# Patient Record
Sex: Male | Born: 1985 | Race: White | Hispanic: No | Marital: Married | Smoking: Current some day smoker
Health system: Southern US, Community
[De-identification: ages and names within clinical notes are randomized; demographics above are authoritative.]

---

## 2017-06-11 ENCOUNTER — Encounter: Payer: Self-pay | Admitting: *Deleted

## 2017-06-11 ENCOUNTER — Encounter: Payer: Self-pay | Admitting: Emergency Medicine

## 2017-06-11 ENCOUNTER — Emergency Department
Admission: EM | Admit: 2017-06-11 | Discharge: 2017-06-11 | Disposition: A | Discharge status: Home / Self Care | Payer: BLUE CROSS/BLUE SHIELD | Attending: Emergency Medicine | Admitting: Emergency Medicine

## 2017-06-11 ENCOUNTER — Ambulatory Visit (INDEPENDENT_AMBULATORY_CARE_PROVIDER_SITE_OTHER)
Admission: EM | Admit: 2017-06-11 | Discharge: 2017-06-11 | Disposition: A | Discharge status: Other Acute Inpatient Hospital | Payer: BLUE CROSS/BLUE SHIELD | Source: Home / Self Care | Attending: Family Medicine | Admitting: Family Medicine

## 2017-06-11 DIAGNOSIS — R002 Palpitations: Secondary | ICD-10-CM | POA: Diagnosis present

## 2017-06-11 DIAGNOSIS — I471 Supraventricular tachycardia: Secondary | ICD-10-CM

## 2017-06-11 DIAGNOSIS — R0789 Other chest pain: Secondary | ICD-10-CM | POA: Diagnosis not present

## 2017-06-11 DIAGNOSIS — F17200 Nicotine dependence, unspecified, uncomplicated: Secondary | ICD-10-CM | POA: Diagnosis not present

## 2017-06-11 DIAGNOSIS — R0602 Shortness of breath: Secondary | ICD-10-CM

## 2017-06-11 LAB — BASIC METABOLIC PANEL
ANION GAP: 6 (ref 5–15)
BUN: 15 mg/dL (ref 6–20)
CHLORIDE: 106 mmol/L (ref 101–111)
CO2: 29 mmol/L (ref 22–32)
Calcium: 9.3 mg/dL (ref 8.9–10.3)
Creatinine, Ser: 0.88 mg/dL (ref 0.61–1.24)
GFR calc Af Amer: >60 mL/min (ref >60)
GFR calc non Af Amer: >60 mL/min (ref >60)
GLUCOSE: 119 mg/dL — AB (ref 65–99)
POTASSIUM: 4.8 mmol/L (ref 3.5–5.1)
Sodium: 141 mmol/L (ref 135–145)

## 2017-06-11 LAB — CBC WITH DIFFERENTIAL/PLATELET
Basophils Absolute: 0 10*3/uL (ref 0–0.1)
Basophils Relative: 1 %
EOS PCT: 2 %
Eosinophils Absolute: 0.1 10*3/uL (ref 0–0.7)
HEMATOCRIT: 44.1 % (ref 40.0–52.0)
Hemoglobin: 15.7 g/dL (ref 13.0–18.0)
LYMPHS PCT: 23 %
Lymphs Abs: 1.5 10*3/uL (ref 1.0–3.6)
MCH: 31.9 pg (ref 26.0–34.0)
MCHC: 35.5 g/dL (ref 32.0–36.0)
MCV: 90 fL (ref 80.0–100.0)
Monocytes Absolute: 0.8 10*3/uL (ref 0.2–1.0)
Monocytes Relative: 12 %
Neutro Abs: 4.1 10*3/uL (ref 1.4–6.5)
Neutrophils Relative %: 62 %
Platelets: 261 10*3/uL (ref 150–440)
RBC: 4.9 MIL/uL (ref 4.40–5.90)
RDW: 13.2 % (ref 11.5–14.5)
WBC: 6.5 10*3/uL (ref 3.8–10.6)

## 2017-06-11 NOTE — ED Provider Notes (Signed)
[facility]    CSN: [id number] Arrival date & time: 06/11/17  3614     History   Chief Complaint Chief Complaint  Patient presents with  . Chest Pain    HPI Marc Roman is a 31 y.o. male.   31 yo male with a c/o "chest discomfort", "heart racing" and shortness of breath that started about 1 hour ago while at work. States he was sitting when symptoms began. Denies dizziness, fainting, numbness/tingling and states he's otherwise healthy.    The history is provided by the patient.  Chest Pain    No past medical history on file.  There are no active problems to display for this patient.   No past surgical history on file.     Home Medications    Prior to Admission medications   Not on File    Family History Family History  Problem Relation Age of Onset  . Anxiety disorder Mother     Social History Social History  Substance Use Topics  . Smoking status: Current Some Day Smoker  . Smokeless tobacco: Never Used  . Alcohol use Yes     Allergies   Penicillins   Review of Systems Review of Systems  Cardiovascular: Positive for chest pain.     Physical Exam Triage Vital Signs ED Triage Vitals  Enc Vitals Group     BP 06/11/17 0855 (!) 145/94     Pulse Rate 06/11/17 0855 (!) 195     Resp 06/11/17 0855 18     Temp 06/11/17 0855 97.7 F (36.5 C)     Temp Source 06/11/17 0855 Oral     SpO2 06/11/17 0855 100 %     Weight 06/11/17 0856 190 lb (86.2 kg)     Height 06/11/17 0856 6\' 2"  (1.88 m)     Head Circumference --      Peak Flow --      Pain Score 06/11/17 0857 0     Pain Loc --      Pain Edu? --      Excl. in [place]? --    No data found.   Updated Vital Signs BP (!) 145/94 (BP Location: Right Arm)   Pulse (!) 195   Temp 97.7 F (36.5 C) (Oral)   Resp 18   Ht 6\' 2"  (1.88 m)   Wt 190 lb (86.2 kg)   SpO2 100%   BMI 24.39 kg/m   Visual Acuity Right Eye Distance:   Left Eye Distance:   Bilateral Distance:     Right Eye Near:   Left Eye Near:    Bilateral Near:     Physical Exam  Constitutional: He appears well-developed and well-nourished. No distress.  Cardiovascular: Normal heart sounds.  Tachycardia present.   Pulmonary/Chest: Effort normal. No respiratory distress. He has no wheezes. He has no rales.  Musculoskeletal: He exhibits no edema.  Skin: He is not diaphoretic.  Nursing note and vitals reviewed.    UC Treatments / Results  Labs (all labs ordered are listed, but only abnormal results are displayed) Labs Reviewed - No data to display  EKG  EKG Interpretation None       Radiology No results found.  Procedures .EKG Date/Time: 06/11/2017 9:42 AM Performed by: Payton Mccallum Authorized by: Payton Mccallum   ECG reviewed by ED Physician in the absence of a cardiologist: yes   Previous ECG:    Previous ECG:  Unavailable Interpretation:    Interpretation: abnormal   Rate:  ECG rate assessment: tachycardic   Rhythm:    Rhythm: SVT   Ectopy:    Ectopy: none   QRS:    QRS axis:  Normal   (including critical care time)  Medications Ordered in UC Medications - No data to display   Initial Impression / Assessment and Plan / UC Course  I have reviewed the triage vital signs and the nursing notes.  Pertinent labs & imaging results that were available during my care of the patient were reviewed by me and considered in my medical decision making (see chart for details).      Final Clinical Impressions(s) / UC Diagnoses   Final diagnoses:  SVT (supraventricular tachycardia) (HCC)    New Prescriptions There are no discharge medications for this patient.  1. ekg results and diagnosis reviewed with patient; attempted vagal maneuvers without change; recommend patient go to ED by EMS for further evaluation and management. Report called to charge RN at [facility].   Controlled Substance Prescriptions [place] Controlled Substance Registry consulted? Not Applicable    Payton Mccallum, MD 06/11/17 [phone]

## 2017-06-11 NOTE — Discharge Instructions (Signed)
Please seek medical attention for any high fevers, chest pain, shortness of breath, change in behavior, persistent vomiting, bloody stool or any other new or concerning symptoms.  

## 2017-06-11 NOTE — ED Triage Notes (Addendum)
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 Patient reported to [facility] care where it was discovered that the patient was in SVT at az rate of 205bpm. Patient self converted before EMS administration of medication. Patient had no complaints currently

## 2017-06-11 NOTE — ED Triage Notes (Signed)
PAtient started having new onset chest discomfort 20 minutes before arrival to [facility]. Patient has no history of previous cardiac conditions.

## 2017-06-11 NOTE — ED Provider Notes (Signed)
[facility] Emergency Department Provider Note   ____________________________________________   I have reviewed the triage vital signs and the nursing notes.   HISTORY  Chief Complaint Palpitations   History limited by: Not cooperative   HPI Marc Vanderlinde is a 31 y.o. male who presents to the emergency department today because of concerns for supraventricular tachycardia found on EKG at urgent care. the patient went to urgent care today because of concerns for palpitations. He states he was at work when he started feeling like his heart was racing. He did have some discomfort and shortness of breath with this. the patient states that he did have some alcohol last night. He denies any caffeine use. Denies any history of SVT in the past. Patient's rhythm did convert spontaneously to normal sinus rhythm during transport to the emergency department.   History reviewed. No pertinent past medical history.  There are no active problems to display for this patient.   History reviewed. No pertinent surgical history.  Prior to Admission medications   Not on File    Allergies Penicillins  Family History  Problem Relation Age of Onset  . Anxiety disorder Mother     Social History Social History  Substance Use Topics  . Smoking status: Current Some Day Smoker  . Smokeless tobacco: Never Used  . Alcohol use Yes    Review of Systems Constitutional: No fever/chills Eyes: No visual changes. ENT: No sore throat. Cardiovascular: positive for palpitations. Respiratory: positive for shortness of breath. Gastrointestinal: No abdominal pain.  No nausea, no vomiting.  No diarrhea.   Genitourinary: Negative for dysuria. Musculoskeletal: Negative for back pain. Skin: Negative for rash. Neurological: Negative for headaches, focal weakness or numbness.  ____________________________________________   PHYSICAL EXAM:  VITAL SIGNS: ED Triage Vitals  [06/11/17 0957]  Enc Vitals Group     BP      Pulse      Resp      Temp      Temp src      SpO2      Weight 190 lb (86.2 kg)     Height  (1.88 m)     Head Circumference      Peak Flow      Pain Score 0   Constitutional: Alert and oriented. Well appearing and in no distress. Eyes: Conjunctivae are normal.  ENT   Head: Normocephalic and atraumatic.   Nose: No congestion/rhinnorhea.   Mouth/Throat: Mucous membranes are moist.   Neck: No stridor. Hematological/Lymphatic/Immunilogical: No cervical lymphadenopathy. Cardiovascular: Normal rate, regular rhythm.  No murmurs, rubs, or gallops.  Respiratory: Normal respiratory effort without tachypnea nor retractions. Breath sounds are clear and equal bilaterally. No wheezes/rales/rhonchi. Gastrointestinal: Soft and non tender. No rebound. No guarding.  Genitourinary: Deferred Musculoskeletal: Normal range of motion in all extremities. No lower extremity edema. Neurologic:  Normal speech and language. No gross focal neurologic deficits are appreciated.  Skin:  Skin is warm, dry and intact. No rash noted. Psychiatric: Mood and affect are normal. Speech and behavior are normal. Patient exhibits appropriate insight and judgment.  ____________________________________________    LABS (pertinent positives/negatives)  CBC wnl BMP wnl save for glu 119  ____________________________________________   EKG  I, Phineas Semen, attending physician, personally viewed and interpreted this EKG  EKG Time: 1000 Rate: 103 Rhythm: sinus tachycardia Axis: left axis deviation Intervals: qtc 442 QRS: LAFB ST changes: no st elevation Impression: abnormal ekg  ____________________________________________    RADIOLOGY  None  ____________________________________________  PROCEDURES  Procedures  ____________________________________________   INITIAL IMPRESSION / ASSESSMENT AND PLAN / ED COURSE  Pertinent labs &  imaging results that were available during my care of the patient were reviewed by me and considered in my medical decision making (see chart for details).  patient presented to the emergency department today because of episode of SVT. By the time patient arrived to the emergency department he was back in a normal sinus rhythm. Blood work without any concerning findings. Did discuss with patient SVT and Valsalva maneuvers. Discussed caffeine and alcohol use.   ____________________________________________   FINAL CLINICAL IMPRESSION(S) / ED DIAGNOSES  Final diagnoses:  SVT (supraventricular tachycardia) (HCC)     Note: This dictation was prepared with Dragon dictation. Any transcriptional errors that result from this process are unintentional     Phineas Semen, MD 06/11/17 1152

## 2019-11-27 ENCOUNTER — Other Ambulatory Visit: Payer: Self-pay

## 2019-11-27 ENCOUNTER — Ambulatory Visit
Admission: EM | Admit: 2019-11-27 | Discharge: 2019-11-27 | Disposition: A | Discharge status: Home / Self Care | Payer: BC Managed Care – PPO | Attending: Family Medicine | Admitting: Family Medicine

## 2019-11-27 DIAGNOSIS — G43109 Migraine with aura, not intractable, without status migrainosus: Secondary | ICD-10-CM | POA: Insufficient documentation

## 2019-11-27 LAB — CBC WITH DIFFERENTIAL/PLATELET
Abs Immature Granulocytes: 0.01 10*3/uL (ref 0.00–0.07)
Basophils Absolute: 0 10*3/uL (ref 0.0–0.1)
Basophils Relative: 1 %
Eosinophils Absolute: 0.1 10*3/uL (ref 0.0–0.5)
Eosinophils Relative: 1 %
HCT: 41.9 % (ref 39.0–52.0)
Hemoglobin: 14.8 g/dL (ref 13.0–17.0)
Immature Granulocytes: 0 %
Lymphocytes Relative: 35 %
Lymphs Abs: 1.7 10*3/uL (ref 0.7–4.0)
MCH: 31.1 pg (ref 26.0–34.0)
MCHC: 35.3 g/dL (ref 30.0–36.0)
MCV: 88 fL (ref 80.0–100.0)
Monocytes Absolute: 0.5 10*3/uL (ref 0.1–1.0)
Monocytes Relative: 11 %
Neutro Abs: 2.5 10*3/uL (ref 1.7–7.7)
Neutrophils Relative %: 52 %
Platelets: 302 10*3/uL (ref 150–400)
RBC: 4.76 MIL/uL (ref 4.22–5.81)
RDW: 13 % (ref 11.5–15.5)
WBC: 4.8 10*3/uL (ref 4.0–10.5)
nRBC: 0 % (ref 0.0–0.2)

## 2019-11-27 LAB — TSH: TSH: 2.8 u[IU]/mL (ref 0.350–4.500)

## 2019-11-27 LAB — MAGNESIUM: Magnesium: 2 mg/dL (ref 1.7–2.4)

## 2019-11-27 LAB — BASIC METABOLIC PANEL
Anion gap: 11 (ref 5–15)
BUN: 13 mg/dL (ref 6–20)
CO2: 24 mmol/L (ref 22–32)
Calcium: 9 mg/dL (ref 8.9–10.3)
Chloride: 98 mmol/L (ref 98–111)
Creatinine, Ser: 0.97 mg/dL (ref 0.61–1.24)
GFR calc Af Amer: >60 mL/min (ref >60)
GFR calc non Af Amer: >60 mL/min (ref >60)
Glucose, Bld: 134 mg/dL — ABNORMAL HIGH (ref 70–99)
Potassium: 4 mmol/L (ref 3.5–5.1)
Sodium: 133 mmol/L — ABNORMAL LOW (ref 135–145)

## 2019-11-27 NOTE — Discharge Instructions (Signed)
Tylenol/ibuprofen as needed Follow up with Primary Care provider Follow up as needed if symptoms worsen or new symptoms develop

## 2019-11-27 NOTE — ED Provider Notes (Signed)
[facility]    CSN: [id number] Arrival date & time: 11/27/19  1254      History   Chief Complaint Chief Complaint  Patient presents with  . Numbness    HPI Marc Roman is a 34 y.o. male.   34 yo male with a c/o numbness and tingling to the right side of his face and his tongue that started spontaneously about 45 minutes ago. States he also feels a mild left sided forehead pain just above the right eye. Denies any vision loss or changes, unilateral weakness, facial weakness, speech or swallowing problems, palpitations, chest pains, shortness of breath, recent illness.      History reviewed. No pertinent past medical history.  There are no problems to display for this patient.   History reviewed. No pertinent surgical history.     Home Medications    Prior to Admission medications   Medication Sig Start Date End Date Taking? Authorizing Provider  finasteride (PROSCAR) 5 MG tablet Take 5 mg by mouth daily.   Yes [provider]    Family History Family History  Problem Relation Age of Onset  . Anxiety disorder Mother     Social History Social History   Tobacco Use  . Smoking status: Current Some Day Smoker    Types: Cigars  . Smokeless tobacco: Never Used  Substance Use Topics  . Alcohol use: Yes    Comment: occasionally  . Drug use: No     Allergies   Penicillins   Review of Systems Review of Systems   Physical Exam Triage Vital Signs ED Triage Vitals  Enc Vitals Group     BP 11/27/19 1313 124/82     Pulse Rate 11/27/19 1313 (!) 101     Resp 11/27/19 1313 18     Temp 11/27/19 1313 97.8 F (36.6 C)     Temp Source 11/27/19 1313 Oral     SpO2 11/27/19 1313 100 %     Weight 11/27/19 1310 170 lb (77.1 kg)     Height 11/27/19 1310 6\' 2"  (1.88 m)     Head Circumference --      Peak Flow --      Pain Score 11/27/19 1309 1     Pain Loc --      Pain Edu? --      Excl. in [place]? --    No data found.  Updated  Vital Signs BP 124/82 (BP Location: Left Arm)   Pulse (!) 101   Temp 97.8 F (36.6 C) (Oral)   Resp 18   Ht 6\' 2"  (1.88 m)   Wt 77.1 kg   SpO2 100%   BMI 21.83 kg/m   Visual Acuity Right Eye Distance:   Left Eye Distance:   Bilateral Distance:    Right Eye Near:   Left Eye Near:    Bilateral Near:     Physical Exam Vitals and nursing note reviewed.  Constitutional:      General: He is not in acute distress.    Appearance: He is not toxic-appearing or diaphoretic.  HENT:     Right Ear: Tympanic membrane normal.     Left Ear: Tympanic membrane normal.  Eyes:     Extraocular Movements: Extraocular movements intact.     Pupils: Pupils are equal, round, and reactive to light.  Cardiovascular:     Rate and Rhythm: Normal rate.  Pulmonary:     Effort: Pulmonary effort is normal. No respiratory distress.  Musculoskeletal:  Cervical back: Neck supple.  Neurological:     General: No focal deficit present.     Mental Status: He is alert and oriented to person, place, and time.     Cranial Nerves: No cranial nerve deficit.     Sensory: No sensory deficit.     Motor: No weakness.     Coordination: Coordination normal.     Gait: Gait normal.     Deep Tendon Reflexes: Reflexes normal.      UC Treatments / Results  Labs (all labs ordered are listed, but only abnormal results are displayed) Labs Reviewed  BASIC METABOLIC PANEL - Abnormal; Notable for the following components:      Result Value   Sodium 133 (*)    Glucose, Bld 134 (*)    All other components within normal limits  CBC WITH DIFFERENTIAL/PLATELET  MAGNESIUM  TSH    EKG   Radiology No results found.  Procedures ED EKG  Date/Time: 11/27/2019 4:49 PM Performed by: Payton Mccallum, MD Authorized by: Payton Mccallum, MD   ECG reviewed by ED Physician in the absence of a cardiologist: yes   Previous ECG:    Previous ECG:  Compared to current   Similarity:  Changes noted Interpretation:     Interpretation: abnormal   Rate:    ECG rate:  78   ECG rate assessment: normal   Rhythm:    Rhythm: sinus rhythm   Ectopy:    Ectopy: none   QRS:    QRS axis:  Normal Conduction:    Conduction: abnormal     Abnormal conduction: incomplete RBBB   ST segments:    ST segments:  Normal T waves:    T waves: normal     (including critical care time)  Medications Ordered in UC Medications - No data to display  Initial Impression / Assessment and Plan / UC Course  I have reviewed the triage vital signs and the nursing notes.  Pertinent labs & imaging results that were available during my care of the patient were reviewed by me and considered in my medical decision making (see chart for details).      Final Clinical Impressions(s) / UC Diagnoses   Final diagnoses:  Migraine with aura and without status migrainosus, not intractable     Discharge Instructions     Tylenol/ibuprofen as needed Follow up with Primary Care provider Follow up as needed if symptoms worsen or new symptoms develop    ED Prescriptions    None      1. Lab results and diagnosis reviewed with patient; discussed normal physical exam findings, EKG findings, and lab result findings 2. Of note during the course of the visit patient reports his numbness symptoms resolved and he only felt the mild pain over his right eye (forehead area) 3. Return precautions given  4. Recommend supportive treatment as above  5. Follow-up prn if symptoms worsen or don't improve   PDMP not reviewed this encounter.   Payton Mccallum, MD 11/27/19 [phone]

## 2019-11-27 NOTE — ED Triage Notes (Signed)
Patient states that he started noticing the right side of face started to feel numb around 45 mins ago. Reports that now he feels like the right side of his tongue is also numb.

## 2019-11-29 ENCOUNTER — Other Ambulatory Visit: Payer: Self-pay | Admitting: Family Medicine

## 2019-11-29 ENCOUNTER — Other Ambulatory Visit (HOSPITAL_COMMUNITY): Payer: Self-pay | Admitting: Family Medicine

## 2019-11-29 ENCOUNTER — Ambulatory Visit
Admission: RE | Admit: 2019-11-29 | Discharge: 2019-11-29 | Disposition: A | Discharge status: Home / Self Care | Payer: BC Managed Care – PPO | Source: Ambulatory Visit | Attending: Family Medicine | Admitting: Family Medicine

## 2019-11-29 ENCOUNTER — Other Ambulatory Visit: Payer: Self-pay

## 2019-11-29 DIAGNOSIS — R2689 Other abnormalities of gait and mobility: Secondary | ICD-10-CM | POA: Diagnosis present

## 2019-11-29 DIAGNOSIS — R278 Other lack of coordination: Secondary | ICD-10-CM

## 2019-11-29 MED ORDER — GADOBUTROL 1 MMOL/ML IV SOLN
7.0000 mL | Freq: Once | INTRAVENOUS | Status: AC | PRN
  Administered 2019-11-29: 15:00:00 7 mL via INTRAVENOUS

## 2019-11-30 ENCOUNTER — Encounter: Payer: Self-pay | Admitting: Neurology

## 2019-12-01 ENCOUNTER — Ambulatory Visit: Payer: BC Managed Care – PPO | Attending: Internal Medicine

## 2019-12-01 DIAGNOSIS — Z20822 Contact with and (suspected) exposure to covid-19: Secondary | ICD-10-CM

## 2019-12-02 LAB — NOVEL CORONAVIRUS, NAA: SARS-CoV-2, NAA: NOT DETECTED

## 2019-12-04 ENCOUNTER — Other Ambulatory Visit: Payer: BC Managed Care – PPO

## 2019-12-06 NOTE — Progress Notes (Signed)
NEUROLOGY CONSULTATION NOTE  Marc Roman MRN: [id number] DOB: 1986-06-25  Referring provider: Blair Heys, MD Primary care provider: Blair Heys, MD  Reason for consult:  Possible multiple sclerosis  HISTORY OF PRESENT ILLNESS: Marc Roman is a 34 year old right-handed white male who presents for numbness and questionable multiple sclerosis.  History supplemented by Urgent Care and referring provider notes.  MRI of brain personally reviewed.  Lip sudden mild headache  Got worse and when 2 hours afster neck.  Numb persisted.  Then next evening coordination and balance on right  Double vision and vertigo in morning briefly.  Not since past Tuesdays.  20 min 5 minu inside of tongue and mouth    On 11/27/2019, he developed sudden onset numbness and tingling involving the right side of his lip, which spread to involve the right side of his inner cheek and long the right jaw line.  Denied facial or extremity weakness, visual disturbance, speech disturbance.  He had a mild headache on his right eyebrow.  He had remote history of migraines as a child and adolescent but this was different.  He was evaluated at [facility] where he was diagnosed with migraine.  He went home.  While the headache resolved, he continued to have right sided facial sensory disturbance.  He also noted that his balance was off and that he had trouble with coordination of his right hand, such as difficulty writing.  In the morning, he would wake up with some dizziness and double vision.  This went on for about 5 days.  He followed up with his PCP who ordered an MRI of the brain with and without contrast performed on 11/29/2019 which demonstrated a 14 mm hyperintense focus in the right brachium pontis as well as few hyperintense foci in the periventricular white matter with possible involvement of the posterior callososeptal interface.  No abnormal enhancement.  Currently, he  still notes some right sided lower facial sensory disturbance and feels slightly unsteady on his feet.  Denies prior neurologic events.  Labs: 11/27/2019:  CBC with WBC 4.8, HGB 14.8, HCT 41.9, PLT 302, ALC 1.7; BMP with Na 133, K 4, Cl 98, CO2 24, glucose 134, BUN 13, Cr 0.97; TSH 2.800. 12/01/2019:  SARS-CoV-2 negative.  Family history is notable for a maternal cousin with brain cancer and maternal great grandfather who died of a cerebral aneurysm.  No family history of multiple sclerosis.  PAST MEDICAL HISTORY: No past medical history on file.  PAST SURGICAL HISTORY: No past surgical history on file.  MEDICATIONS: Current Outpatient Medications on File Prior to Visit  Medication Sig Dispense Refill  . finasteride (PROSCAR) 5 MG tablet Take 5 mg by mouth daily.     No current facility-administered medications on file prior to visit.    ALLERGIES: Allergies  Allergen Reactions  . Penicillins Rash    FAMILY HISTORY: Family History  Problem Relation Age of Onset  . Anxiety disorder Mother     SOCIAL HISTORY: Social History   Socioeconomic History  . Marital status: Married    Spouse name: Not on file  . Number of children: Not on file  . Years of education: Not on file  . Highest education level: Not on file  Occupational History  . Not on file  Tobacco Use  . Smoking status: Current Some Day Smoker    Types: Cigars  . Smokeless tobacco: Never Used  Substance and Sexual Activity  . Alcohol  use: Yes    Comment: occasionally  . Drug use: No  . Sexual activity: Not on file  Other Topics Concern  . Not on file  Social History Narrative  . Not on file   Social Determinants of Health   Financial Resource Strain:   . Difficulty of Paying Living Expenses:   Food Insecurity:   . Worried About Charity fundraiser in the Last Year:   . Arboriculturist in the Last Year:   Transportation Needs:   . Film/video editor (Medical):   Marland Kitchen Lack of Transportation  (Non-Medical):   Physical Activity:   . Days of Exercise per Week:   . Minutes of Exercise per Session:   Stress:   . Feeling of Stress :   Social Connections:   . Frequency of Communication with Friends and Family:   . Frequency of Social Gatherings with Friends and Family:   . Attends Religious Services:   . Active Member of Clubs or Organizations:   . Attends Archivist Meetings:   Marland Kitchen Marital Status:   Intimate Partner Violence:   . Fear of Current or Ex-Partner:   . Emotionally Abused:   Marland Kitchen Physically Abused:   . Sexually Abused:     REVIEW OF SYSTEMS: Constitutional: No fevers, chills, or sweats, no generalized fatigue, change in appetite Eyes: No visual changes, double vision, eye pain Ear, nose and throat: No hearing loss, ear pain, nasal congestion, sore throat Cardiovascular: No chest pain, palpitations Respiratory:  No shortness of breath at rest or with exertion, wheezes GastrointestinaI: No nausea, vomiting, diarrhea, abdominal pain, fecal incontinence Genitourinary:  No dysuria, urinary retention or frequency Musculoskeletal:  No neck pain, back pain Integumentary: No rash, pruritus, skin lesions Neurological: as above Psychiatric: No depression, insomnia, anxiety Endocrine: No palpitations, fatigue, diaphoresis, mood swings, change in appetite, change in weight, increased thirst Hematologic/Lymphatic:  No purpura, petechiae. Allergic/Immunologic: no itchy/runny eyes, nasal congestion, recent allergic reactions, rashes  PHYSICAL EXAM: Blood pressure 118/85, pulse 82, resp. rate 18, height 6\' 2"  (1.88 m), weight 169 lb (76.7 kg), SpO2 98 %. General: No acute distress.  Patient appears well-groomed.  Head:  Normocephalic/atraumatic Eyes:  fundi examined but not visualized Neck: supple, no paraspinal tenderness, full range of motion Back: No paraspinal tenderness Heart: regular rate and rhythm Lungs: Clear to auscultation bilaterally. Vascular: No  carotid bruits. Neurological Exam: Mental status: alert and oriented to person, place, and time, recent and remote memory intact, fund of knowledge intact, attention and concentration intact, speech fluent and not dysarthric, language intact. Cranial nerves: CN I: not tested CN II: pupils equal, round and reactive to light, visual fields intact CN III, IV, VI:  full range of motion, no nystagmus, no ptosis CN V: facial sensation intact CN VII: upper and lower face symmetric CN VIII: hearing intact CN IX, X: gag intact, uvula midline CN XI: sternocleidomastoid and trapezius muscles intact CN XII: tongue midline Bulk & Tone: normal, no fasciculations. Motor:  5/5 throughout  Sensation:  Pinprick and vibration sensation intact. Deep Tendon Reflexes:  2+ throughout, toes downgoing.  Finger to nose testing:  Without dysmetria.  Heel to shin:  Without dysmetria.  Gait:  Normal station and stride.  Able to turn but difficulty with tandem walk Romberg negative.  IMPRESSION: 1.  Cerebral white matter abnormality on brain MRI in setting of facial numbness, incoordination and diplopia.  Concern for demyelinating disease such as multiple sclerosis.  PLAN: 1.  We will  proceed with MS workup:  - Serum for ANA, sed rate, ANCA, SSA/SSB antibodies, vitamin D   - MRI of cervical and thoracic spine with and without contrast  -  If MRI of spinal cord unremarkable, proceed with lumbar puncture for CSF analysis of cell count, glucose, protein, gram stain and culture, oligoclonal bands, IgG index. 2.  Follow up after testing to discuss results and plan.  Thank you for allowing me to take part in the care of this patient.  Shon Millet, DO  CC:  Blair Heys, MD

## 2019-12-07 ENCOUNTER — Other Ambulatory Visit: Payer: Self-pay

## 2019-12-07 ENCOUNTER — Encounter: Payer: Self-pay | Admitting: Neurology

## 2019-12-07 ENCOUNTER — Ambulatory Visit: Payer: BC Managed Care – PPO | Admitting: Neurology

## 2019-12-07 ENCOUNTER — Telehealth: Payer: Self-pay

## 2019-12-07 ENCOUNTER — Other Ambulatory Visit (INDEPENDENT_AMBULATORY_CARE_PROVIDER_SITE_OTHER): Payer: BC Managed Care – PPO

## 2019-12-07 VITALS — BP 118/85 | HR 82 | Resp 18 | Ht 74.0 in | Wt 169.0 lb

## 2019-12-07 DIAGNOSIS — R9082 White matter disease, unspecified: Secondary | ICD-10-CM

## 2019-12-07 DIAGNOSIS — R200 Anesthesia of skin: Secondary | ICD-10-CM

## 2019-12-07 DIAGNOSIS — R2681 Unsteadiness on feet: Secondary | ICD-10-CM

## 2019-12-07 NOTE — Telephone Encounter (Signed)
Request Status: Authorized Health Plan: [employer] Valid Dates: 12/07/2019 - 06/03/2020 Scheduled Date of Service: 12/07/2019 Member Information: Marc Roman, Marc Roman Member #: [id number] [address] [place] , [place] [id number] Date of Birth: 02-Dec-1985 Phone: [phone] Ordering Provider: Brooke Dare [address] [place] , [place] [id number] Phone: [phone] Fax: [phone] NPI: [id number] Servicing Provider: [facility] [address] [place] , [place] [address] Phone: [phone] Fax: [phone] NPI: [id number] TIN: [id number]

## 2019-12-07 NOTE — Patient Instructions (Signed)
1.  We will check some blood work today:  ANA, sed rate, ANCA, SSA/SSB antibodies, vitamin D level 2.  We will also schedule for MRI of cervical and thoracic spine with and without contrast 3.  We may need to order a spinal tap, however it depends on the MRI findings.  We will check cell count with diff, glucose, protein, gram stain and culture, oligoclonal bands, IgG index. 4.  Follow up after testing

## 2019-12-07 NOTE — Telephone Encounter (Signed)
Mri of cervical spine with and without placed and MRI thoracic spine with and without ordered for March 30 at [facility] at 330. Pending authorizationt to be sent call number [phone].

## 2019-12-08 LAB — ANA: Anti Nuclear Antibody (ANA): NEGATIVE

## 2019-12-08 LAB — SEDIMENTATION RATE: Sed Rate: 2 mm/h (ref 0–15)

## 2019-12-08 LAB — SJOGREN'S SYNDROME ANTIBODS(SSA + SSB)
SSA (Ro) (ENA) Antibody, IgG: <1 AI
SSB (La) (ENA) Antibody, IgG: <1 AI

## 2019-12-08 LAB — ANCA SCREEN W REFLEX TITER: ANCA Screen: NEGATIVE

## 2019-12-08 LAB — VITAMIN D 25 HYDROXY (VIT D DEFICIENCY, FRACTURES): Vit D, 25-Hydroxy: 9 ng/mL — ABNORMAL LOW (ref 30–100)

## 2019-12-08 NOTE — Telephone Encounter (Signed)
Close encounter 

## 2019-12-20 ENCOUNTER — Other Ambulatory Visit: Payer: Self-pay

## 2019-12-20 ENCOUNTER — Ambulatory Visit
Admission: RE | Admit: 2019-12-20 | Discharge: 2019-12-20 | Disposition: A | Discharge status: Home / Self Care | Payer: BC Managed Care – PPO | Source: Ambulatory Visit | Attending: Neurology | Admitting: Neurology

## 2019-12-20 DIAGNOSIS — R200 Anesthesia of skin: Secondary | ICD-10-CM

## 2019-12-20 DIAGNOSIS — R2681 Unsteadiness on feet: Secondary | ICD-10-CM | POA: Diagnosis present

## 2019-12-20 DIAGNOSIS — R9082 White matter disease, unspecified: Secondary | ICD-10-CM

## 2019-12-20 MED ORDER — GADOBUTROL 1 MMOL/ML IV SOLN
7.5000 mL | Freq: Once | INTRAVENOUS | Status: AC | PRN
  Administered 2019-12-20: 17:00:00 7.5 mL via INTRAVENOUS

## 2019-12-29 ENCOUNTER — Telehealth (INDEPENDENT_AMBULATORY_CARE_PROVIDER_SITE_OTHER): Payer: BC Managed Care – PPO | Admitting: Neurology

## 2019-12-29 ENCOUNTER — Other Ambulatory Visit: Payer: Self-pay

## 2019-12-29 DIAGNOSIS — G35 Multiple sclerosis: Secondary | ICD-10-CM

## 2019-12-29 NOTE — Progress Notes (Signed)
Labs ordered Per DR. Jaffe. LVMOM for pt.

## 2019-12-29 NOTE — Patient Instructions (Addendum)
1.  I have listed several MS medications below.  After you review them, contact me to let me know which you would like to try.  2.  In the meantime, I would like you to start vitamin D supplementation.  Take over the counter D3 4000 IU daily.  3.  I would like to check some blood work that may be needed prior to starting some of these medications.  Contact our office when you plan on coming for your labs (we will place the order at the lab on the second floor of our building, where you had the previous labs drawn).  4.  I would like to refer you to the [facility] Multiple Sclerosis Society website for further information:  Nationalmssociety.org 5.  I will have you follow up with me in 6 months.   Multiple Sclerosis Multiple sclerosis (MS) is a disease of the brain, spinal cord, and optic nerves (central nervous system). It causes the body's disease-fighting (immune) system to destroy the protective covering (myelin sheath) around nerves in the brain. When this happens, signals (nerve impulses) going to and from the brain and spinal cord do not get sent properly or may not get sent at all. There are several types of MS:  Relapsing-remitting MS. This is the most common type. This causes sudden attacks of symptoms. After an attack, you may recover completely until the next attack, or some symptoms may remain permanently.  Secondary progressive MS. This usually develops after the onset of relapsing-remitting MS. Similar to relapsing-remitting MS, this type also causes sudden attacks of symptoms. Attacks may be less frequent, but symptoms slowly get worse (progress) over time.  Primary progressive MS. This causes symptoms that steadily progress over time. This type of MS does not cause sudden attacks of symptoms. The age of onset of MS varies, but it often develops between 35-41 years of age. MS is a lifelong (chronic) condition. There is no cure, but treatment can help slow down the progression of the  disease. What are the causes? The cause of this condition is not known. What increases the risk? You are more likely to develop this condition if:  You are a woman.  You have a relative with MS. However, the condition is not passed from parent to child (inherited).  You have a lack (deficiency) of vitamin D.  You smoke. MS is more common in the [place] than in the [place]. What are the signs or symptoms? Relapsing-remitting and secondary progressive MS cause symptoms to occur in episodes or attacks that may last weeks to months. There may be long periods between attacks in which there are almost no symptoms. Primary progressive MS causes symptoms to steadily progress after they develop. Symptoms of MS vary because of the many different ways it affects the central nervous system. The main symptoms include:  Vision problems and eye pain.  Numbness.  Weakness.  Inability to move your arms, hands, feet, or legs (paralysis).  Balance problems.  Shaking that you cannot control (tremors).  Muscle spasms.  Problems with thinking (cognitive changes). MS can also cause symptoms that are associated with the disease, but are not always the direct result of an MS attack. They may include:  Inability to control urination or bowel movements (incontinence).  Headaches.  Fatigue.  Inability to tolerate heat.  Emotional changes.  Depression.  Pain. How is this diagnosed? This condition is diagnosed based on:  Your symptoms.  A neurological exam. This involves checking central  nervous system function, such as nerve function, reflexes, and coordination.  MRIs of the brain and spinal cord.  Lab tests, including a lumbar puncture that tests the fluid that surrounds the brain and spinal cord (cerebrospinal fluid).  Tests to measure the electrical activity of the brain in response to stimulation (evoked potentials). How is this treated? There is no  cure for MS, but medicines can help decrease the number and frequency of attacks and help relieve nuisance symptoms. Treatment options may include:  Medicines that reduce the frequency of attacks. These medicines may be given by injection, by mouth (orally), or through an IV.  Medicines that reduce inflammation (steroids). These may provide short-term relief of symptoms.  Medicines to help control pain, depression, fatigue, or incontinence.  Vitamin D, if you have a deficiency.  Using devices to help you move around (assistive devices), such as braces, a cane, or a walker.  Physical therapy to strengthen and stretch your muscles.  Occupational therapy to help you with everyday tasks.  Alternative or complementary treatments such as exercise, massage, or acupuncture. Follow these instructions at home:  Take over-the-counter and prescription medicines only as told by your health care provider.  Do not drive or use heavy machinery while taking prescription pain medicine.  Use assistive devices as recommended by your physical therapist or your health care provider.  Exercise as directed by your health care provider.  Return to your normal activities as told by your health care provider. Ask your health care provider what activities are safe for you.  Reach out for support. Share your feelings with friends, family, or a support group.  Keep all follow-up visits as told by your health care provider and therapists. This is important. Where to find more information  National Multiple Sclerosis Society: https://www.nationalmssociety.org Contact a health care provider if:  You feel depressed.  You develop new pain or numbness.  You have tremors.  You have problems with sexual function. Get help right away if:  You develop paralysis.  You develop numbness.  You have problems with your bladder or bowel function.  You develop double vision.  You lose vision in one or both  eyes.  You develop suicidal thoughts.  You develop severe confusion. If you ever feel like you may hurt yourself or others, or have thoughts about taking your own life, get help right away. You can go to your nearest emergency department or call:  Your local emergency services (911 in the [place]  A suicide crisis helpline, such as the National Suicide Prevention Lifeline at (518)424-4664. This is open 24 hours a day. Summary  Multiple sclerosis (MS) is a disease of the central nervous system that causes the body's immune system to destroy the protective covering (myelin sheath) around nerves in the brain.  There are 3 types of MS: relapsing-remitting, secondary progressive, and primary progressive. Relapsing-remitting and secondary progressive MS cause symptoms to occur in episodes or attacks that may last weeks to months. Primary progressive MS causes symptoms to steadily progress after they develop.  There is no cure for MS, but medicines can help decrease the number and frequency of attacks and help relieve nuisance symptoms. Treatment may also include physical or occupational therapy.  If you develop numbness, paralysis, vision problems, or other neurological symptoms, get help right away. This information is not intended to replace advice given to you by your health care provider. Make sure you discuss any questions you have with your health care provider. Document Revised: 08/20/2017  Document Reviewed: 11/16/2016 Elsevier Patient Education  2020 Elsevier Inc.  MS MEDICATIONS:  AUBAGIO Teriflunomide oral tablets What is this medicine? TERIFLUNOMIDE (TER i FLOO noe mide) treats multiple sclerosis. It can decrease the number of flare-ups. This medicine is not a cure. This medicine may be used for other purposes; ask your health care provider or pharmacist if you have questions. COMMON BRAND NAME(S): AUBAGIO What should I tell my health care provider before I take this  medicine? They need to know if you have any of these conditions:  diabetes  have a fever or infection  high blood pressure  immune system problems  kidney disease  liver disease  low blood cell counts, like low white cell, platelet, or red cell counts  lung or breathing disease, like asthma  recently received or scheduled to receive a vaccine  receiving treatment for cancer  skin conditions or sensitivity  tingling of the fingers or toes, or other nerve disorder  tuberculosis  an unusual or allergic reaction to teriflunomide, leflunomide, other medicines, food, dyes, or preservatives  pregnant or trying to get pregnant  breast-feeding How should I use this medicine? Take this medicine by mouth with a glass of water. Follow the directions on the prescription label. You can take it with or without food. If it upsets your stomach, take it with food. Take your medicine at regular intervals. Do not take it more often that directed. Do not stop taking except on your doctor's advice. A special MedGuide will be given to you by the pharmacist with each prescription and refill. Be sure to read this information carefully each time. Talk to your pediatrician regarding the use of this medicine in children. Special care may be needed. Overdosage: If you think you have taken too much of this medicine contact a poison control center or emergency room at once. NOTE: This medicine is only for you. Do not share this medicine with others. What if I miss a dose? If you miss a dose, take it as soon as you can. If it is almost time for your next dose, take only that dose. Do not take double or extra doses. What may interact with this medicine? Do not take this medicine with any of the following medications:  leflunomide This medicine may also interact with the following medications:  alosetron  birth control pills  caffeine  cefaclor  certain medicines for diabetes like nateglinide,  repaglinide, rosiglitazone, pioglitazone  certain medicines for high cholesterol like atorvastatin, pravastatin, rosuvastatin, simvastatin  charcoal  cholestyramine  ciprofloxacin  duloxetine  furosemide  ketoprofen  live virus vaccines  medicines that increase your risk for infection  methotrexate  mitoxantrone  paclitaxel  penicillin  theophylline  tizanidine  warfarin This list may not describe all possible interactions. Give your health care provider a list of all the medicines, herbs, non-prescription drugs, or dietary supplements you use. Also tell them if you smoke, drink alcohol, or use illegal drugs. Some items may interact with your medicine. What should I watch for while using this medicine? Visit your health care provider for regular checks on your progress. Tell your doctor or health care provider if your symptoms do not start to get better or if they get worse. You may need blood work done while you are taking this medicine. This medicine may cause serious skin reactions. They can happen weeks to months after starting the medicine. Contact your health care provider right away if you notice fevers or flu-like symptoms with a  rash. The rash may be red or purple and then turn into blisters or peeling of the skin. Or, you might notice a red rash with swelling of the face, lips or lymph nodes in your neck or under your arms. This medicine may stay in your body for up to 2 years after your last dose. Tell your doctor about any unusual side effects or symptoms. A medicine can be given to help lower your blood levels of this medicine more quickly. Women must use effective birth control with this medicine. There is a potential for serious side effects to an unborn child. Do not become pregnant while taking this medicine. Inform your doctor if you wish to become pregnant. This medicine remains in your blood after you stop taking it. You must continue using effective birth  control until the blood levels have been checked and they are low enough. A medicine can be given to help lower your blood levels of this medicine more quickly. Immediately talk to your doctor if you think you may be pregnant. You may need a pregnancy test. Talk to your health care provider or pharmacist for more information. For men, your partner should not become pregnant while you are taking this medicine. There is a potential for serious side effects to an unborn child. You and your male partner should use effective birth control during your treatment. This medicine is found in the semen of men. This medicine remains in your blood after you stop taking it. Men who wish to father a child should continue using effective birth control until the blood levels of this medicine have been checked and they are low enough. A medicine can be given to help lower your blood levels of this medicine more quickly. You should not receive certain vaccines during your treatment and for 6 months after your treatment with this medication ends. What side effects may I notice from receiving this medicine? Side effects that you should report to your doctor or health care professional as soon as possible:  allergic reactions like skin rash, itching or hives, swelling of the face, lips, or tongue  breathing problems  cough  increased blood pressure  low blood counts - this medicine may decrease the number of white blood cells and platelets. You may be at increased risk for infections and bleeding.  pain, tingling, numbness in the hands or feet  rash, fever, and swollen lymph nodes  redness, blistering, peeling or loosening of the skin, including inside the mouth  signs of decreased platelets or bleeding - bruising, pinpoint red spots on the skin, black, tarry stools, blood in urine  signs of infection - fever or chills, cough, sore throat, pain or trouble passing urine  signs and symptoms of liver injury like  dark yellow or brown urine; general ill feeling or flu-like symptoms; light-colored stools; loss of appetite; nausea; right upper belly pain; unusually weak or tired; yellowing of the eyes or skin  trouble passing urine or change in the amount of urine  vomiting Side effects that usually do not require medical attention (report to your doctor or health care professional if they continue or are bothersome):  diarrhea  hair thinning or loss  headache  nausea  tiredness This list may not describe all possible side effects. Call your doctor for medical advice about side effects. You may report side effects to FDA at 1-800-FDA-1088. Where should I keep my medicine? Keep out of the reach of children. Store between 20 to 25 degrees  C (68 to 77 degrees F). Throw away any unused medicine after the expiration date. NOTE: This sheet is a summary. It may not cover all possible information. If you have questions about this medicine, talk to your doctor, pharmacist, or health care provider.  2020 Elsevier/Gold Standard (2018-12-16 15:39:47)    VUMERITY: Diroximel fumarate delayed-release capsules What is this medicine? DIROXIMEL FUMARATE (dye ROX i mel FUE ma rate) helps to decrease the number of multiple sclerosis relapses in people with relapsing-remitting forms of the disease. It is not a cure. This medicine may be used for other purposes; ask your health care provider or pharmacist if you have questions. COMMON BRAND NAME(S): VUMERITY What should I tell my health care provider before I take this medicine? They need to know if you have any of these conditions:  immune system problems  infection  kidney disease  liver disease  low blood counts, like white cells  an unusual or allergic reaction to diroximel fumarate, dimethyl fumarate, other medicines, foods, dyes, or preservatives  pregnant or trying to get pregnant  breast-feeding How should I use this medicine? Take this  medicine by mouth with a glass of water. Follow the directions on the prescription label. Do not cut, crush, or chew this medicine. Swallow the capsules whole. You can take it with or without food. If it upsets your stomach, take it with food. Do not drink alcohol at the time you take a dose of this medicine. Take your medicine at regular intervals. Do not take it more often than directed. Do not stop taking except on your doctor's advice. Talk to your pediatrician regarding the use of this medicine in children. Special care may be needed. Overdosage: If you think you have taken too much of this medicine contact a poison control center or emergency room at once. NOTE: This medicine is only for you. Do not share this medicine with others. What if I miss a dose? If you miss a dose, take it as soon as you can. If it is almost time for your next dose, take only that dose. Do not take double or extra doses. What may interact with this medicine? Do not take this medicine with the following medications:  dimethyl fumarate  monomethyl fumarate This medicine may also interact with the following medications:  alcohol This list may not describe all possible interactions. Give your health care provider a list of all the medicines, herbs, non-prescription drugs, or dietary supplements you use. Also tell them if you smoke, drink alcohol, or use illegal drugs. Some items may interact with your medicine. What should I watch for while using this medicine? Visit your health care professional for regular checks on your progress. Tell your health care professional if your symptoms do not start to get better or if they get worse. You may need blood work done while you are taking this medicine. Taking your medicine with food (avoid high-fat, high-calorie meal or snack) may help reduce flushing. Call your doctor if the flushing from this medicine bothers you or does not go away. Ask your doctor if taking aspirin before  taking this medicine may reduce flushing. This medicine may increase your risk of getting an infection. Call your health care professional for advice if you get a fever, chills, or sore throat, or other symptoms of a cold or flu. Do not treat yourself. Try to avoid being around people who are sick. In some patients, this medicine may cause a serious brain infection that may  cause death. If you have any problems seeing, thinking, speaking, walking, or standing, tell your healthcare professional right away. If you cannot reach your healthcare professional, urgently seek other source of medical care. What side effects may I notice from receiving this medicine? Side effects that you should report to your doctor or health care professional as soon as possible:  allergic reactions like skin rash, itching or hives, swelling of the face, lips, or tongue  fever, chills, sore throat  signs and symptoms of liver injury like dark yellow or brown urine; general ill feeling or flu-like symptoms; light-colored stools; loss of appetite; nausea; right upper belly pain; unusually weak or tired; yellowing of the eyes or skin  signs and symptoms of a serious brain infection like changes in vision, confusion, weakness on one side of the body, loss of balance or coordination, loss of memory, or changes in personality Side effects that usually do not require medical attention (report to your doctor or health care professional if they continue or are bothersome):  diarrhea  facial flushing  nausea  stomach pain  upset stomach This list may not describe all possible side effects. Call your doctor for medical advice about side effects. You may report side effects to FDA at 1-800-FDA-1088. Where should I keep my medicine? Keep out of the reach of children. Store at room temperature between 20 and 25 degrees C (68 and 77 degrees F). Throw away any unused medicine after the expiration date. NOTE: This sheet is a  summary. It may not cover all possible information. If you have questions about this medicine, talk to your doctor, pharmacist, or health care provider.  2020 Elsevier/Gold Standard (2019-03-31 16:27:28)  ZEPOSIA Ozanimod capsules What is this medicine? OZANIMOD (oh ZAN i mod) helps prevent relapses of multiple sclerosis. The medicine does not cure multiple sclerosis. This medicine may be used for other purposes; ask your health care provider or pharmacist if you have questions. COMMON BRAND NAME(S): ZEPOSIA What should I tell my health care provider before I take this medicine? They need to know if you have any of these conditions:  diabetes  heart disease  high blood pressure  history of irregular heartbeat  history of stroke  immune system problems  infection (especially a virus infection such as chickenpox, cold sores, or herpes)  liver disease  low blood counts, like low white cell, platelet, or red cell counts  lung or breathing disease, like asthma or sleep apnea  skin cancer  recently received or scheduled to receive a vaccine  uveitis  an unusual or allergic reaction to ozanimod, other medicines, foods, dyes, or preservatives  pregnant or trying to get pregnant  breast-feeding How should I use this medicine? Take this medicine by mouth with a glass of water. Follow the directions on the prescription label. Do not cut, crush or chew this medicine. Swallow the capsules whole. You can take it with or without food. If it upsets your stomach, take it with food. Take your medicine at regular intervals. Do not take it more often than directed. Do not stop taking except on your doctor's advice. A special MedGuide will be given to you by the pharmacist with each prescription and refill. Be sure to read this information carefully each time. Talk to your pediatrician regarding the use of this medicine in children. Special care may be needed. Overdosage: If you think you  have taken too much of this medicine contact a poison control center or emergency room at once.  NOTE: This medicine is only for you. Do not share this medicine with others. What if I miss a dose? If you miss 1 or more doses during the first 14 days of treatment, talk to your healthcare provider. You will need to begin another starter pack. If you miss a dose after the first 14 days of treatment, skip it. Take your next dose at the normal time. Do not take extra or 2 doses at the same time to make up for the missed dose. What may interact with this medicine? Do not take this medicine with any of the following medications:  linezolid  MAOIs like Azilect, Carbex, Eldepryl, Marplan, Nardil, Parnate, and Xadago This medicine may also interact with the following medications:  alemtuzumab  certain medicines for blood pressure, heart disease, irregular heart beat  certain medicines for depression, anxiety  clopidogrel  cyclosporine  eltrombopag  gemfibrozil  live virus vaccines  medicines that lower your chance of fighting infection  other medicines that prolong the QT interval (abnormal heart rhythm)  narcotic medicines for pain  phenylephrine  pseudoephedrine  rifampin  stimulant medicines for attention disorders, weight loss, or staying awake  tryptophan  tyramine This list may not describe all possible interactions. Give your health care provider a list of all the medicines, herbs, non-prescription drugs, or dietary supplements you use. Also tell them if you smoke, drink alcohol, or use illegal drugs. Some items may interact with your medicine. What should I watch for while using this medicine? Visit your health care professional for regular checks on your progress. Tell your health care professional if your symptoms do not start to get better or if they get worse. You may need blood work done while you are taking this medicine. Foods that contain very high amounts of  tyramine, such as aged, fermented, cured, smoked and pickled foods, should be avoided while taking this medicine. The combination may cause a dangerous rise in blood pressure. Ask your doctor or health care professional, pharmacist, or nutritionist for a complete listing of foods and beverages that are high in tyramine. If you consume a food or beverage very rich in tyramine and do not feel well soon after eating, contact your health care provider. Tell your doctor or health care professional right away if you have any change in your eyesight. Talk with your doctor if you have not had chickenpox or the vaccine for chickenpox. This medicine can make you more sensitive to the sun. Keep out of the sun. If you cannot avoid being in the sun, wear protective clothing and sunscreen. Do not use sun lamps or tanning beds/booths. Women should inform their doctor if they wish to become pregnant or think they might be pregnant. There is a potential for serious side effects to an unborn child. If you are a male who can become pregnant, you should use effective birth control during your treatment with this medicine and for at least 3 months after you stop taking it. Talk to your health care professional or pharmacist for more information. If you stop taking this medicine, your MS symptoms may get worse. You may have more weakness, trouble using your arms or legs, or changes in balance. Talk to your healthcare provider right away if your symptoms get worse. What side effects may I notice from receiving this medicine? Side effects that you should report to your doctor or health care professional as soon as possible:  allergic reactions like skin rash, itching or hives, swelling of the  face, lips, or tongue  breathing problems  changes in vision  confusion  headache with fever, neck stiffness, sensitivity to light, nausea, or confusion  high blood pressure  loss of memory  problems with balance, talking, or  walking  signs and symptoms of a dangerous change in heartbeat or heart rhythm like chest pain; dizziness; fast or irregular heartbeat; palpitations; feeling faint or lightheaded, falls; breathing problems  signs and symptoms of infection like fever or chills; cough; sore throat; pain or trouble passing urine  signs and symptoms of liver injury like dark yellow or brown urine; general ill feeling or flu-like symptoms; light-colored stools; loss of appetite; nausea; right upper belly pain; unusually weak or tired; yellowing of the eyes or skin  change in your skin's appearance (color, change in a mole or freckle, new growth)  seizures  sudden numbness or weakness of the face, arm or leg  unusually slow heartbeat Side effects that usually do not require medical attention (report to your doctor or health care professional if they continue or are bothersome):  back pain  dizziness  sinus trouble This list may not describe all possible side effects. Call your doctor for medical advice about side effects. You may report side effects to FDA at 1-800-FDA-1088. Where should I keep my medicine? Keep out of the reach of children. Store at room temperature between 20 and 25 degrees C (68 and 77 degrees F). Throw away any unused medicine after the expiration date. NOTE: This sheet is a summary. It may not cover all possible information. If you have questions about this medicine, talk to your doctor, pharmacist, or health care provider.  2020 Elsevier/Gold Standard (2019-02-21 14:27:29)  GILENYA Fingolimod oral capsules What is this medicine? FINGOLIMOD (fin GOL i mod) helps prevent relapses of multiple sclerosis. The medicine does not cure multiple sclerosis. This medicine may be used for other purposes; ask your health care provider or pharmacist if you have questions. COMMON BRAND NAME(S): Gilenya What should I tell my health care provider before I take this medicine? They need to know if  you have any of these conditions:  diabetes  heart disease  high blood pressure  history of irregular heartbeat  history of stroke  immune system problems  infection (especially a virus infection such as chickenpox, cold sores, or herpes)  liver disease  low blood counts, like low white cell, platelet, or red cell counts  lung or breathing disease, like asthma  skin cancer  recently received or scheduled to receive a vaccine  uveitis  an unusual or allergic reaction to fingolimod, other medicines, foods, dyes, or preservatives  pregnant or trying to get pregnant  breast-feeding How should I use this medicine? Take this medicine by mouth with a glass of water. Follow the directions on the prescription label. You can take it with or without food. If it upsets your stomach, take it with food. Take your medicine at regular intervals. Do not take it more often than directed. Do not stop taking except on your doctor's advice. A special MedGuide will be given to you by the pharmacist with each prescription and refill. Be sure to read this information carefully each time. Talk to your pediatrician regarding the use of this medicine in children. While this drug may be prescribed for children as young as 10 years, precautions do apply. Overdosage: If you think you have taken too much of this medicine contact a poison control center or emergency room at once. NOTE: This medicine  is only for you. Do not share this medicine with others. What if I miss a dose? It is important not to miss any doses. If you miss a dose, take it as soon as you can. If it is almost time for your next dose, take only that dose. Do not take double or extra doses. What may interact with this medicine? Do not take this medicine with any of the following medications:  arsenic trioxide  certain antipsychotics like pimozide, thioridazine  certain medicines for irregular heart beat like amiodarone,  disopyramide, ibutilide, procainamide, propafenone, quinidine, sotalol  certain medicines used for nausea like chlorpromazine, droperidol  certain medicines used to treat infections like chloroquine, clarithromycin, erythromycin, pentamidine  cisapride  dextromethorphan; quinidine  dronedarone  methadone  posaconazole  saquinavir This medicine may also interact with the following medications:  beta-blockers like metoprolol and propranolol  citalopram  digoxin  diltiazem  dofetilide  haloperidol  ketoconazole  live virus vaccines  medicines that lower your chance of fighting infection  mitoxantrone  natalizumab  verapamil  ziprasidone This list may not describe all possible interactions. Give your health care provider a list of all the medicines, herbs, non-prescription drugs, or dietary supplements you use. Also tell them if you smoke, drink alcohol, or use illegal drugs. Some items may interact with your medicine. What should I watch for while using this medicine? Tell your doctor or healthcare professional if your symptoms do not start to get better or if they get worse. Your vision and blood may be tested before and during use of this medicine. Tell your doctor or health care professional right away if you have any change in your eyesight. After the first dose, you will be watched for at least 6 hours. Children will also be watched for at least 6 hours if their dose is increased. If you miss more than 1 dose, you may need to be watched by a health care professional when you take your next dose; do not start taking it again without talking with your doctor. Talk with your doctor if you have not had chickenpox or the vaccine for chickenpox. This medicine can make you more sensitive to the sun. Keep out of the sun. If you cannot avoid being in the sun, wear protective clothing and use sunscreen. Do not use sun lamps or tanning beds/booths. Women should inform their  doctor if they wish to become pregnant or think they might be pregnant. There is a potential for serious side effects to an unborn child. If you are a male who can become pregnant, you should use effective birth control during your treatment with this medicine and for at least 2 months after you stop taking it. Talk to your health care professional or pharmacist for more information. If you stop taking this medicine, your MS symptoms may get worse. You may have more weakness, trouble using your arms or legs, or changes in balance. Talk to your healthcare provider right away if your symptoms get worse. What side effects may I notice from receiving this medicine? Side effects that you should report to your doctor or health care professional as soon as possible:  allergic reactions like skin rash, itching or hives, swelling of the face, lips, or tongue  breathing problems  changes in vision  confusion  headache with fever, neck stiffness, sensitivity to light, nausea, or confusion  loss of memory  problems with balance, talking, or walking  signs and symptoms of a dangerous change in heartbeat or  heart rhythm like chest pain; dizziness; fast or irregular heartbeat; palpitations; feeling faint or lightheaded, falls; breathing problems  signs and symptoms of infection like fever or chills; cough; sore throat; pain or trouble passing urine  signs and symptoms of liver injury like dark yellow or brown urine; general ill feeling or flu-like symptoms; light-colored stools; loss of appetite; nausea; right upper belly pain; unusually weak or tired; yellowing of the eyes or skin  change in your skin's appearance (color, change in a mole or freckle, new growth)  seizures  sudden numbness or weakness of the face, arm or leg  unusually slow heartbeat Side effects that usually do not require medical attention (report to your doctor or health care professional if they continue or are  bothersome):  back pain  diarrhea  headache  pain in legs or arms  sinus trouble This list may not describe all possible side effects. Call your doctor for medical advice about side effects. You may report side effects to FDA at 1-800-FDA-1088. Where should I keep my medicine? Keep out of the reach of children. Store at room temperature between 15 and 30 degrees C (59 and 86 degrees F). Throw away any unused medicine after the expiration date. NOTE: This sheet is a summary. It may not cover all possible information. If you have questions about this medicine, talk to your doctor, pharmacist, or health care provider.  2020 Elsevier/Gold Standard (2018-12-16 14:12:58)  MAYZENT Siponimod tablets What is this medicine? Siponimod (si PON i mod) may help prevent relapses of multiple sclerosis. This medicine is not a cure. This medicine may be used for other purposes; ask your health care provider or pharmacist if you have questions. COMMON BRAND NAME(S): MAYZENT What should I tell my health care provider before I take this medicine? They need to know if you have any of these conditions:  cancer  diabetes  heart disease  high blood pressure  history of irregular heartbeat  history of stroke  immune system problems  infection (especially a viral infection such as chickenpox, cold sores, or herpes)  liver disease  low blood counts, like white cells, platelets, or red blood cells  lung or breathing disease, like asthma  recently received or scheduled to receive a vaccine  uveitis  an unusual or allergic reaction to siponimod, other medicines, foods, dyes, or preservatives  pregnant or trying to get pregnant  breast-feeding How should I use this medicine? Take this medicine by mouth with a glass of water. Follow the directions on the prescription label. You can take it with or without food. If it upsets your stomach, take it with food. Take your medicine at regular  intervals. Do not take it more often than directed. Do not stop taking except on your doctor's advice. A special MedGuide will be given to you by the pharmacist with each prescription and refill. Be sure to read this information carefully each time. Talk to your pediatrician about the use of this medicine in children. Special care may be needed. Overdosage: If you think you have taken too much of this medicine contact a poison control center or emergency room at once. NOTE: This medicine is only for you. Do not share this medicine with others. What if I miss a dose? It is important not to miss any doses. Talk to your healthcare professional about what to do if you miss a dose. What may interact with this medicine? Do not take this medicine with any of the following medications:  cisapride  dronedarone  pimozide  saquinavir  thioridazine This medicine may also interact with the following medications:  alemtuzumab  certain antivirals for HIV or hepatitis  certain medicines for blood pressure, heart disease, irregular heart beat  certain medicines for fungal infections like ketoconazole, itraconazole, or posaconazole  certain medicines for seizures like carbamazepine, phenobarbital, phenytoin  dofetilide  live virus vaccines  medicines that lower your chance of fighting infection  modafinil  rifampin This list may not describe all possible interactions. Give your health care provider a list of all the medicines, herbs, non-prescription drugs, or dietary supplements you use. Also tell them if you smoke, drink alcohol, or use illegal drugs. Some items may interact with your medicine. What should I watch for while using this medicine? Visit your healthcare professional for regular checks on your progress. Tell your healthcare professional if your symptoms do not start to get better or if they get worse. In some patients, this medicine may cause a serious brain infection that may  cause death. If you have any problems seeing, thinking, speaking, walking, or standing, tell your healthcare professional right away. If you cannot reach your healthcare professional, urgently seek other source of medical care. Tell your doctor or health care professional right away if you have any change in your eyesight. Your vision may be tested before and during use of this medicine. You may need blood work done while you are taking this medicine. Call your healthcare professional for advice if you get a fever, chills, or sore throat, or other symptoms of a cold or flu. Do not treat yourself. This medicine decreases your body's ability to fight infections. Try to avoid being around people who are sick. Talk with your doctor if you have not had chickenpox or the vaccine for chickenpox. This medicine can decrease the response to a vaccine. If you need to get vaccinated, tell your healthcare professional if you have received this medicine within the last 4 weeks. Extra booster doses may be needed. Talk to your healthcare professional to see if a different vaccination schedule is needed. Talk to your healthcare professional about your risk of cancer. You may be more at risk for certain types of cancer if you take this medicine. Women should inform their doctor if they wish to become pregnant or think they might be pregnant. There is a potential for serious side effects to an unborn child. If you are a male who can become pregnant, you should use effective birth control during your treatment with this medicine and for at least 10 days after you stop taking it. Talk to your health care professional for more information. If you stop taking this medicine, your MS symptoms may get worse. You may have more weakness, trouble using your arms or legs, or changes in balance. Talk to your healthcare provider right away if your symptoms get worse. What side effects may I notice from receiving this medicine? Side  effects that you should report to your doctor or health care professional as soon as possible:  allergic reactions like skin rash, itching or hives, swelling of the face, lips, or tongue  breathing problems  changes in vision  confusion  headache with fever, neck stiffness, sensitivity to light, nausea, or confusion  signs and symptoms of a dangerous change in heartbeat or heart rhythm like chest pain; dizziness; fast or irregular heartbeat; palpitations; feeling faint or lightheaded; falls; breathing problems  signs and symptoms of a stroke like changes in vision; confusion; trouble  speaking or understanding; severe headaches; sudden numbness or weakness of the face, arm or leg; trouble walking; dizziness; loss of balance or coordination  signs and symptoms of infection like fever or chills; cough; sore throat; pain or trouble passing urine  signs and symptoms of liver injury like dark yellow or brown urine; general ill feeling or flu-like symptoms; light-colored stools; loss of appetite; nausea; right upper belly pain; unusually weak or tired; yellowing of the eyes or skin  seizures  unusually slow heartbeat Side effects that usually do not require medical attention (report to your doctor or health care professional if they continue or are bothersome):  diarrhea  dizziness  headache  pain in legs or arms This list may not describe all possible side effects. Call your doctor for medical advice about side effects. You may report side effects to FDA at 1-800-FDA-1088. Where should I keep my medicine? Keep out of the reach of children. You will be instructed on how to store this medicine. Throw away any unused medicine after the expiration date. NOTE: This sheet is a summary. It may not cover all possible information. If you have questions about this medicine, talk to your doctor, pharmacist, or health care provider.  2020 Elsevier/Gold Standard (2018-08-30  10:14:02)   TYSABRI Natalizumab injection What is this medicine? NATALIZUMAB (na ta LIZ you mab) is used to treat relapsing multiple sclerosis. This drug is not a cure. It is also used to treat Crohn's disease. This medicine may be used for other purposes; ask your health care provider or pharmacist if you have questions. COMMON BRAND NAME(S): Tysabri What should I tell my health care provider before I take this medicine? They need to know if you have any of these conditions:  immune system problems  progressive multifocal leukoencephalopathy (PML)  an unusual or allergic reaction to natalizumab, other medicines, foods, dyes, or preservatives  pregnant or trying to get pregnant  breast-feeding How should I use this medicine? This medicine is for infusion into a vein. It is given by a health care professional in a hospital or clinic setting. A special MedGuide will be given to you by the pharmacist with each prescription and refill. Be sure to read this information carefully each time. Talk to your pediatrician regarding the use of this medicine in children. This medicine is not approved for use in children. Overdosage: If you think you have taken too much of this medicine contact a poison control center or emergency room at once. NOTE: This medicine is only for you. Do not share this medicine with others. What if I miss a dose? It is important not to miss your dose. Call your doctor or health care professional if you are unable to keep an appointment. What may interact with this medicine? Do not take this medicine with any of the following medications:  biologic medicines such as adalimumab, certolizumab, etanercept, golimumab, infliximab This medicine may also interact with the following medications:  azathioprine  cyclosporine  interferons  6-mercaptopurine  methotrexate  other medicines that lower your chance of fighting an infection  steroid medicines like  prednisone or cortisone  vaccines This list may not describe all possible interactions. Give your health care provider a list of all the medicines, herbs, non-prescription drugs, or dietary supplements you use. Also tell them if you smoke, drink alcohol, or use illegal drugs. Some items may interact with your medicine. What should I watch for while using this medicine? Your condition will be monitored carefully while you  are receiving this medicine. Visit your doctor for regular check ups. Tell your doctor or healthcare professional if your symptoms do not start to get better or if they get worse. Stay away from people who are sick. Call your doctor or health care professional for advice if you get a fever, chills or sore throat, or other symptoms of a cold or flu. Do not treat yourself. In some patients, this medicine may cause a serious brain infection that may cause death. If you have any problems seeing, thinking, speaking, walking, or standing, tell your doctor right away. If you cannot reach your doctor, get urgent medical care. What side effects may I notice from receiving this medicine? Side effects that you should report to your doctor or health care professional as soon as possible:  allergic reactions like skin rash, itching or hives, swelling of the face, lips, or tongue  breathing problems  changes in vision  chest pain  confusion  depressed mood  dizziness  feeling faint; lightheaded; falls  general ill feeling or flu-like symptoms  loss of memory  missed menstrual periods  muscle weakness  problems with balance, talking, or walking  signs and symptoms of liver injury like dark yellow or brown urine; general ill feeling or flu-like symptoms; light-colored stools; loss of appetite; nausea; right upper belly pain; unusually weak or tired; yellowing of the eyes or skin  suicidal thoughts, mood changes  unusual bruising or bleeding  unusually weak or tired Side  effects that usually do not require medical attention (report to your doctor or health care professional if they continue or are bothersome):  headache  joint pain  muscle cramps  muscle pain  nausea, vomiting  pain, redness, or irritation at site where injected  tiredness This list may not describe all possible side effects. Call your doctor for medical advice about side effects. You may report side effects to FDA at 1-800-FDA-1088. Where should I keep my medicine? This drug is given in a hospital or clinic and will not be stored at home. NOTE: This sheet is a summary. It may not cover all possible information. If you have questions about this medicine, talk to your doctor, pharmacist, or health care provider.  2020 Elsevier/Gold Standard (2019-03-13 13:20:26)  OCREVUS Ocrelizumab injection What is this medicine? OCRELIZUMAB (ok re LIZ ue mab) treats multiple sclerosis. It helps to decrease the number of multiple sclerosis relapses. It is not a cure. This medicine may be used for other purposes; ask your health care provider or pharmacist if you have questions. COMMON BRAND NAME(S): OCREVUS What should I tell my health care provider before I take this medicine? They need to know if you have any of these conditions:  cancer  hepatitis B infection  other infection (especially a virus infection such as chickenpox, cold sores, or herpes)  an unusual or allergic reaction to ocrelizumab, other medicines, foods, dyes or preservatives  pregnant or trying to get pregnant  breast-feeding How should I use this medicine? This medicine is for infusion into a vein. It is given by a health care professional in a hospital or clinic setting. A special MedGuide will be given to you before each treatment. Be sure to read this information carefully each time. Talk to your pediatrician regarding the use of this medicine in children. Special care may be needed. Overdosage: If you think you  have taken too much of this medicine contact a poison control center or emergency room at once. NOTE: This medicine is  only for you. Do not share this medicine with others. What if I miss a dose? Keep appointments for follow-up doses as directed. It is important not to miss your dose. Call your doctor or health care professional if you are unable to keep an appointment. What may interact with this medicine?  alemtuzumab  daclizumab  dimethyl fumarate  fingolimod  glatiramer  interferon beta  live virus vaccines  mitoxantrone  natalizumab  peginterferon beta  rituximab  steroid medicines like prednisone or cortisone  teriflunomide This list may not describe all possible interactions. Give your health care provider a list of all the medicines, herbs, non-prescription drugs, or dietary supplements you use. Also tell them if you smoke, drink alcohol, or use illegal drugs. Some items may interact with your medicine. What should I watch for while using this medicine? Tell your doctor or healthcare professional if your symptoms do not start to get better or if they get worse. This medicine can cause serious allergic reactions. To reduce your risk you may need to take medicine before treatment with this medicine. Take your medicine as directed. Women should inform their doctor if they wish to become pregnant or think they might be pregnant. There is a potential for serious side effects to an unborn child. Talk to your health care professional or pharmacist for more information. Male patients should use effective birth control methods while receiving this medicine and for 6 months after the last dose. Call your doctor or health care professional for advice if you get a fever, chills or sore throat, or other symptoms of a cold or flu. Do not treat yourself. This drug decreases your body's ability to fight infections. Try to avoid being around people who are sick. If you have a hepatitis  B infection or a history of a hepatitis B infection, talk to your doctor. The symptoms of hepatitis B may get worse if you take this medicine. In some patients, this medicine may cause a serious brain infection that may cause death. If you have any problems seeing, thinking, speaking, walking, or standing, tell your doctor right away. If you cannot reach your doctor, urgently seek other source of medical care. This medicine can decrease the response to a vaccine. If you need to get vaccinated, tell your healthcare professional if you have received this medicine. Extra booster doses may be needed. Talk to your doctor to see if a different vaccination schedule is needed. Talk to your doctor about your risk of cancer. You may be more at risk for certain types of cancers if you take this medicine. What side effects may I notice from receiving this medicine? Side effects that you should report to your doctor or health care professional as soon as possible:  allergic reactions like skin rash, itching or hives, swelling of the face, lips, or tongue  breathing problems  facial flushing  fast, irregular heartbeat  lump or soreness in the breast  signs and symptoms of herpes such as cold sore, shingles, or genital sores  signs and symptoms of infection like fever or chills, cough, sore throat, pain or trouble passing urine  signs and symptoms of low blood pressure like dizziness; feeling faint or lightheaded, falls; unusually weak or tired  signs and symptoms of progressive multifocal leukoencephalopathy (PML) like changes in vision; clumsiness; confusion; personality changes; weakness on one side of the body  swelling of the ankles, feet, hands Side effects that usually do not require medical attention (report these to your doctor  or health care professional if they continue or are bothersome):  back pain  depressed mood  diarrhea  pain, redness, or irritation at site where injected This  list may not describe all possible side effects. Call your doctor for medical advice about side effects. You may report side effects to FDA at 1-800-FDA-1088. Where should I keep my medicine? This drug is given in a hospital or clinic and will not be stored at home. NOTE: This sheet is a summary. It may not cover all possible information. If you have questions about this medicine, talk to your doctor, pharmacist, or health care provider.  2020 Elsevier/Gold Standard (2018-09-12 07:41:53)

## 2019-12-29 NOTE — Progress Notes (Signed)
Virtual Visit via Video Note The purpose of this virtual visit is to provide medical care while limiting exposure to the novel coronavirus.    Consent was obtained for video visit:  Yes.   Answered questions that patient had about telehealth interaction:  Yes.   I discussed the limitations, risks, security and privacy concerns of performing an evaluation and management service by telemedicine. I also discussed with the patient that there may be a patient responsible charge related to this service. The patient expressed understanding and agreed to proceed.  Pt location: Home Physician Location: office Name of referring provider:  [facility] I connected with Marc Roman at patients initiation/request on 12/29/2019 at 11:30 AM EDT by video enabled telemedicine application and verified that I am speaking with the correct person using two identifiers. Pt MRN:  [id number] Pt DOB:  17-Nov-1985 Video Participants:  Marc Roman   History of Present Illness:  Marc Roman is a 33 year old right-handed white male who follows up for evaluation of multiple sclerosis.  UPDATE: Labs from 12/07/2019 showed negative ANA, sed rate 2, negative SSA/SSB antibodies, negative ANCA.  Vitamin D 25 hydroxy was 9.  MRI of cervical and thoracic spinal cord with and without contrast performed on 12/20/2019 personally reviewed and demonstrated non-enhancing lesion involving the right dorsal lateral cord at the level of C4-5, no involvement of the thoracic spinal cord.  He reports some mild residual facial numbness but otherwise, all other symptoms resolved.  HISTORY: On 11/27/2019, he developed sudden onset numbness and tingling involving the right side of his lip, which spread to involve the right side of his inner cheek and long the right jaw line.  Denied facial or extremity weakness, visual disturbance, speech disturbance.  He had a mild headache on his right eyebrow.  He  had remote history of migraines as a child and adolescent but this was different.  He was evaluated at [facility] where he was diagnosed with migraine.  He went home.  While the headache resolved, he continued to have right sided facial sensory disturbance.  He also noted that his balance was off and that he had trouble with coordination of his right hand, such as difficulty writing.  In the morning, he would wake up with some dizziness and double vision.  This went on for about 5 days.  He followed up with his PCP who ordered an MRI of the brain with and without contrast performed on 11/29/2019 which demonstrated a 14 mm hyperintense focus in the right brachium pontis as well as few hyperintense foci in the periventricular white matter with possible involvement of the posterior callososeptal interface.  No abnormal enhancement.  Currently, he still notes some right sided lower facial sensory disturbance and feels slightly unsteady on his feet.  Denies prior neurologic events.  Labs: 11/27/2019:  CBC with WBC 4.8, HGB 14.8, HCT 41.9, PLT 302, ALC 1.7; BMP with Na 133, K 4, Cl 98, CO2 24, glucose 134, BUN 13, Cr 0.97; TSH 2.800. 12/01/2019:  SARS-CoV-2 negative.  Family history is notable for a maternal cousin with brain cancer and maternal great grandfather who died of a cerebral aneurysm.  No family history of multiple sclerosis.  Past Medical History: No past medical history on file.  Medications: Outpatient Encounter Medications as of 12/29/2019  Medication Sig  . finasteride (PROSCAR) 5 MG tablet Take 5 mg by mouth daily.  Marland Kitchen triamcinolone cream (KENALOG) 0.1 % Apply  to aa's lower legs BID 5d/wk. Avoid f/g/a.   No facility-administered encounter medications on file as of 12/29/2019.    Allergies: Allergies  Allergen Reactions  . Penicillins Rash    Family History: Family History  Problem Relation Age of Onset  . Anxiety disorder Mother     Social History:  Social History   Socioeconomic History  . Marital status: Married    Spouse name: Not on file  . Number of children: Not on file  . Years of education: Not on file  . Highest education level: Not on file  Occupational History  . Not on file  Tobacco Use  . Smoking status: Current Some Day Smoker    Types: Cigars  . Smokeless tobacco: Never Used  Substance and Sexual Activity  . Alcohol use: Yes    Comment: occasionally  . Drug use: No  . Sexual activity: Not on file  Other Topics Concern  . Not on file  Social History Narrative   Right handed   One story home   Drinks caffeine green tea on occasion   Social Determinants of Health   Financial Resource Strain:   . Difficulty of Paying Living Expenses:   Food Insecurity:   . Worried About Programme researcher, broadcasting/film/video in the Last Year:   . Barista in the Last Year:   Transportation Needs:   . Freight forwarder (Medical):   Marland Kitchen Lack of Transportation (Non-Medical):   Physical Activity:   . Days of Exercise per Week:   . Minutes of Exercise per Session:   Stress:   . Feeling of Stress :   Social Connections:   . Frequency of Communication with Friends and Family:   . Frequency of Social Gatherings with Friends and Family:   . Attends Religious Services:   . Active Member of Clubs or Organizations:   . Attends Banker Meetings:   Marland Kitchen Marital Status:   Intimate Partner Violence:   . Fear of Current or Ex-Partner:   . Emotionally Abused:   Marland Kitchen Physically Abused:   . Sexually Abused:     Observations/Objective:   There were no vitals taken for this visit. No acute distress.  Alert and oriented.  Speech fluent and not dysarthric.  Language intact.    Assessment and Plan:   Multiple sclerosis.  Given the lesion in the cervical spinal cord in conjunction with the lesions in the cerebral white matter, I believe the diagnosis is confirmed and LP not warranted.  We will initiate DMT.  1.  I have asked him to  review several medication options (such as Aubagio, Vumerity, Zeposia, Gilenya, Mayzent, Tysabri, Ocrevus).  He will contact me with his decision 2.  Will check CBC with diff, CMP, Hepatitis B panel, varicella zoster antibody, Quantiferon TB Gold, JC Virus antibody with Index. 3.  Advised to start D3 4000 IU daily. 4.  Provided him information on MS and directed him to the [facility] MS Society website. 5.  Follow up in 6 months.  I answered all questions to the best of my ability.  Follow Up Instructions:    -I discussed the assessment and treatment plan with the patient. The patient was provided an opportunity to ask questions and all were answered. The patient agreed with the plan and demonstrated an understanding of the instructions.   The patient was advised to call back or seek an in-person evaluation if the symptoms worsen or if the condition fails to improve  as anticipated.   Dudley Major, DO

## 2020-01-01 ENCOUNTER — Other Ambulatory Visit (INDEPENDENT_AMBULATORY_CARE_PROVIDER_SITE_OTHER): Payer: Self-pay | Admitting: Vascular Surgery

## 2020-01-01 ENCOUNTER — Encounter (INDEPENDENT_AMBULATORY_CARE_PROVIDER_SITE_OTHER): Payer: Self-pay | Admitting: Vascular Surgery

## 2020-01-01 ENCOUNTER — Other Ambulatory Visit: Payer: Self-pay

## 2020-01-01 ENCOUNTER — Ambulatory Visit (INDEPENDENT_AMBULATORY_CARE_PROVIDER_SITE_OTHER): Payer: BC Managed Care – PPO

## 2020-01-01 ENCOUNTER — Ambulatory Visit (INDEPENDENT_AMBULATORY_CARE_PROVIDER_SITE_OTHER): Payer: BC Managed Care – PPO | Admitting: Vascular Surgery

## 2020-01-01 DIAGNOSIS — I872 Venous insufficiency (chronic) (peripheral): Secondary | ICD-10-CM

## 2020-01-03 ENCOUNTER — Encounter (INDEPENDENT_AMBULATORY_CARE_PROVIDER_SITE_OTHER): Payer: Self-pay | Admitting: Vascular Surgery

## 2020-01-03 DIAGNOSIS — I872 Venous insufficiency (chronic) (peripheral): Secondary | ICD-10-CM | POA: Insufficient documentation

## 2020-01-03 NOTE — Progress Notes (Signed)
MRN : [id number]  Marc Roman is a 34 y.o. (07-19-1986) male who presents with chief complaint of  Chief Complaint  Patient presents with  . New Patient (Initial Visit)    r/o vascular issues related to shambergs  .  History of Present Illness:   Patient is sent for evaluation of discoloration of both ankles.  This is associated with some swelling that worsens as the day progresses.  In the morning he does not note any edema.  The discoloration does not seem to wax or wane.  However, over time it it seems to be increasing  Current Meds  Medication Sig  . finasteride (PROSCAR) 5 MG tablet Take 5 mg by mouth daily.  Marland Kitchen triamcinolone cream (KENALOG) 0.1 % Apply to aa's lower legs BID 5d/wk. Avoid f/g/a.    No past medical history on file.  No past surgical history on file.  Social History Social History   Tobacco Use  . Smoking status: Current Some Day Smoker    Types: Cigars  . Smokeless tobacco: Never Used  Substance Use Topics  . Alcohol use: Yes    Comment: occasionally  . Drug use: No    Family History Family History  Problem Relation Age of Onset  . Anxiety disorder Mother   No family history of bleeding/clotting disorders, porphyria or autoimmune disease   Allergies  Allergen Reactions  . Penicillins Rash     REVIEW OF SYSTEMS (Negative unless checked)  Constitutional: [] Weight loss  [] Fever  [] Chills Cardiac: [] Chest pain   [] Chest pressure   [] Palpitations   [] Shortness of breath when laying flat   [] Shortness of breath with exertion. Vascular:  [] Pain in legs with walking   [] Pain in legs at rest  [] History of DVT   [] Phlebitis   [] Swelling in legs   [] Varicose veins   [] Non-healing ulcers Pulmonary:   [] Uses home oxygen   [] Productive cough   [] Hemoptysis   [] Wheeze  [] COPD   [] Asthma Neurologic:  [] Dizziness   [] Seizures   [] History of stroke   [] History of TIA  [] Aphasia   [] Vissual changes   [] Weakness or numbness in arm   [] Weakness or  numbness in leg Musculoskeletal:   [] Joint swelling   [] Joint pain   [] Low back pain Hematologic:  [] Easy bruising  [] Easy bleeding   [] Hypercoagulable state   [] Anemic Gastrointestinal:  [] Diarrhea   [] Vomiting  [] Gastroesophageal reflux/heartburn   [] Difficulty swallowing. Genitourinary:  [] Chronic kidney disease   [] Difficult urination  [] Frequent urination   [] Blood in urine Skin:  [] Rashes   [] Ulcers  Psychological:  [] History of anxiety   []  History of major depression.  Physical Examination  Vitals:   01/01/20 1502  BP: 119/72  Pulse: 73  Weight: 172 lb (78 kg)  Height: 6\' 2"  (1.88 m)   Body mass index is 22.08 kg/m. Gen: WD/WN, NAD Head: [place]/AT, No temporalis wasting.  Ear/Nose/Throat: Hearing grossly intact, nares w/o erythema or drainage, poor dentition Eyes: PER, EOMI, sclera nonicteric.  Neck: Supple, no masses.  No bruit or JVD.  Pulmonary:  Good air movement, clear to auscultation bilaterally, no use of accessory muscles.  Cardiac: RRR, normal S1, S2, no Murmurs. Vascular: In the medial aspect of both ankles there is an area of discoloration consistent with venous stasis dermatitis. Vessel Right Left  PT Palpable Palpable  DP Palpable Palpable  Gastrointestinal: soft, non-distended. No guarding/no peritoneal signs.  Musculoskeletal: M/S 5/5 throughout.  No deformity or atrophy.  Neurologic: CN 2-12  intact. Pain and light touch intact in extremities.  Symmetrical.  Speech is fluent. Motor exam as listed above. Psychiatric: Judgment intact, Mood & affect appropriate for pt's clinical situation.  CBC Lab Results  Component Value Date   WBC 4.8 11/27/2019   HGB 14.8 11/27/2019   HCT 41.9 11/27/2019   MCV 88.0 11/27/2019   PLT 302 11/27/2019    BMET    Component Value Date/Time   NA 133 (L) 11/27/2019 1329   K 4.0 11/27/2019 1329   CL 98 11/27/2019 1329   CO2 24 11/27/2019 1329   GLUCOSE 134 (H) 11/27/2019 1329   BUN 13 11/27/2019 1329   CREATININE 0.97  11/27/2019 1329   CALCIUM 9.0 11/27/2019 1329   GFRNONAA >60 11/27/2019 1329   GFRAA >60 11/27/2019 1329   CrCl cannot be calculated (Patient's most recent lab result is older than the maximum 21 days allowed.).  COAG No results found for: INR, PROTIME  Radiology MR CERVICAL SPINE W WO CONTRAST  Result Date: 12/20/2019 CLINICAL DATA:  Initial evaluation for possible demyelinating disease, recent abnormal brain MRI. Patient with right facial numbness, unsteady gait. EXAM: MRI CERVICAL AND THORACIC SPINE WITHOUT AND WITH CONTRAST TECHNIQUE: Multiplanar and multiecho pulse sequences of the cervical spine, to include the craniocervical junction and cervicothoracic junction, and the thoracic spine, were obtained without and with intravenous contrast. CONTRAST:  7.13mL GADAVIST GADOBUTROL 1 MMOL/ML IV SOLN COMPARISON:  None. FINDINGS: MRI CERVICAL SPINE FINDINGS Alignment: Physiologic with preservation of the normal cervical lordosis. No listhesis. Vertebrae: Vertebral body height maintained without evidence for acute or chronic fracture. Bone marrow signal intensity within normal limits. No discrete or worrisome osseous lesions. No abnormal marrow edema or enhancement. Cord: Patchy T2 signal abnormality seen within the right dorsal lateral cord at the level of C4-5, suspicious for demyelinating disease (series 8, image 14). No other focal cord signal abnormality. No abnormal enhancement to suggest active demyelination. Underlying cord caliber and morphology within normal limits. Posterior Fossa, vertebral arteries, paraspinal tissues: Visualized brain and posterior fossa within normal limits. Craniocervical junction normal. Paraspinous and prevertebral soft tissues within normal limits. Normal intravascular flow voids seen within the vertebral arteries bilaterally. Disc levels: C2-C3: Unremarkable. C3-C4:  Unremarkable. C4-C5:  Unremarkable. C5-C6: Small central disc protrusion mildly indents the ventral  thecal sac. No significant spinal stenosis or cord deformity. Foramina remain patent. C6-C7:  Unremarkable. C7-T1:  Unremarkable. MRI THORACIC SPINE FINDINGS Alignment: Mild dextroscoliosis. Alignment otherwise normal with preservation of the normal thoracic kyphosis. No listhesis. Vertebrae: Vertebral body height maintained without evidence for acute or chronic fracture. Bone marrow signal intensity within normal limits. No discrete or worrisome osseous lesions. No abnormal marrow edema or enhancement. Cord: Signal intensity within the thoracic spinal cord is normal. No cord signal abnormality to suggest demyelinating disease. Normal cord caliber and morphology. No abnormal enhancement. Paraspinal and other soft tissues: Unremarkable. Disc levels: No significant disc pathology seen within the thoracic spine. No disc bulge or disc protrusion. No significant canal or foraminal stenosis. No impingement. IMPRESSION: MRI CERVICAL SPINE IMPRESSION: 1. Focus of cord signal abnormality involving the right dorsal lateral cord at the level of C4-5, suspicious for demyelinating disease. No abnormal enhancement to suggest active demyelination. No other cord signal changes within the cervical spine. 2. Small central disc protrusion at C5-6 without stenosis. MRI THORACIC SPINE IMPRESSION: 1. Normal MRI appearance of the thoracic spinal cord. No findings to suggest demyelinating disease or other abnormality. No abnormal enhancement. 2. No significant disc pathology,  stenosis, or evidence for neural impingement. Electronically Signed   By: Rise Mu M.D.   On: 12/20/2019 19:57   MR THORACIC SPINE W WO CONTRAST  Result Date: 12/20/2019 CLINICAL DATA:  Initial evaluation for possible demyelinating disease, recent abnormal brain MRI. Patient with right facial numbness, unsteady gait. EXAM: MRI CERVICAL AND THORACIC SPINE WITHOUT AND WITH CONTRAST TECHNIQUE: Multiplanar and multiecho pulse sequences of the cervical  spine, to include the craniocervical junction and cervicothoracic junction, and the thoracic spine, were obtained without and with intravenous contrast. CONTRAST:  7.66mL GADAVIST GADOBUTROL 1 MMOL/ML IV SOLN COMPARISON:  None. FINDINGS: MRI CERVICAL SPINE FINDINGS Alignment: Physiologic with preservation of the normal cervical lordosis. No listhesis. Vertebrae: Vertebral body height maintained without evidence for acute or chronic fracture. Bone marrow signal intensity within normal limits. No discrete or worrisome osseous lesions. No abnormal marrow edema or enhancement. Cord: Patchy T2 signal abnormality seen within the right dorsal lateral cord at the level of C4-5, suspicious for demyelinating disease (series 8, image 14). No other focal cord signal abnormality. No abnormal enhancement to suggest active demyelination. Underlying cord caliber and morphology within normal limits. Posterior Fossa, vertebral arteries, paraspinal tissues: Visualized brain and posterior fossa within normal limits. Craniocervical junction normal. Paraspinous and prevertebral soft tissues within normal limits. Normal intravascular flow voids seen within the vertebral arteries bilaterally. Disc levels: C2-C3: Unremarkable. C3-C4:  Unremarkable. C4-C5:  Unremarkable. C5-C6: Small central disc protrusion mildly indents the ventral thecal sac. No significant spinal stenosis or cord deformity. Foramina remain patent. C6-C7:  Unremarkable. C7-T1:  Unremarkable. MRI THORACIC SPINE FINDINGS Alignment: Mild dextroscoliosis. Alignment otherwise normal with preservation of the normal thoracic kyphosis. No listhesis. Vertebrae: Vertebral body height maintained without evidence for acute or chronic fracture. Bone marrow signal intensity within normal limits. No discrete or worrisome osseous lesions. No abnormal marrow edema or enhancement. Cord: Signal intensity within the thoracic spinal cord is normal. No cord signal abnormality to suggest  demyelinating disease. Normal cord caliber and morphology. No abnormal enhancement. Paraspinal and other soft tissues: Unremarkable. Disc levels: No significant disc pathology seen within the thoracic spine. No disc bulge or disc protrusion. No significant canal or foraminal stenosis. No impingement. IMPRESSION: MRI CERVICAL SPINE IMPRESSION: 1. Focus of cord signal abnormality involving the right dorsal lateral cord at the level of C4-5, suspicious for demyelinating disease. No abnormal enhancement to suggest active demyelination. No other cord signal changes within the cervical spine. 2. Small central disc protrusion at C5-6 without stenosis. MRI THORACIC SPINE IMPRESSION: 1. Normal MRI appearance of the thoracic spinal cord. No findings to suggest demyelinating disease or other abnormality. No abnormal enhancement. 2. No significant disc pathology, stenosis, or evidence for neural impingement. Electronically Signed   By: Rise Mu M.D.   On: 12/20/2019 19:57     Assessment/Plan 1. Venous stasis dermatitis of both lower extremities No surgery or intervention at this point in time.    I have had a long discussion with the patient regarding venous insufficiency and why it  causes symptoms. I have discussed with the patient the chronic skin changes that accompany venous insufficiency and the long term sequela such as infection and ulceration.  Patient will begin wearing graduated compression stockings on a daily basis a prescription was given. The patient will put the stockings on first thing in the morning and removing them in the evening. The patient is instructed specifically not to sleep in the stockings.    In addition, behavioral modification including several periods  of elevation of the lower extremities during the day will be continued. I have demonstrated that proper elevation is a position with the ankles at heart level.  The patient is instructed to begin routine exercise, especially  walking on a daily basis  Patient's duplex ultrasound of the venous system is negative for DVT and significant reflux is not present.  Patient will follow up with me as needed   Levora Dredge, MD  01/03/2020 4:24 PM

## 2020-01-08 ENCOUNTER — Ambulatory Visit: Payer: BC Managed Care – PPO | Admitting: Neurology

## 2020-02-29 ENCOUNTER — Ambulatory Visit: Payer: BC Managed Care – PPO | Admitting: Dermatology

## 2020-02-29 ENCOUNTER — Other Ambulatory Visit: Payer: Self-pay

## 2020-02-29 DIAGNOSIS — L817 Pigmented purpuric dermatosis: Secondary | ICD-10-CM

## 2020-02-29 MED ORDER — TRIAMCINOLONE ACETONIDE 0.1 % EX CREA: TOPICAL_CREAM | CUTANEOUS | 0 refills | Status: AC

## 2020-02-29 NOTE — Progress Notes (Signed)
   Follow-Up Visit   Subjective  Marc Roman is a 34 y.o. male who presents for the following: schamberg's purpura (patient currently using TMC 0.1% to aa's and his wife thinks she's noticed an improvement). Patient did see a vascular surgeon and was diagnosed with varicose veins and recommended graduated compression stockings.  The following portions of the chart were reviewed this encounter and updated as appropriate:  Tobacco  Allergies  Meds  Problems  Med Hx  Surg Hx  Fam Hx     Review of Systems:  No other skin or systemic complaints except as noted in HPI or Assessment and Plan.  Objective  Well appearing patient in no apparent distress; mood and affect are within normal limits.  A focused examination was performed including B/L lower legs. Relevant physical exam findings are noted in the Assessment and Plan.  Objective  B/L lower leg: Macular brown spotty hyperpigmentation   Assessment & Plan  Schamberg's purpura B/L lower leg  Improved -  Recommend graduated compression (15-44mmHg) stockings daily. Hand out given for different brands. Continue TMC 0.1% cream decrease to QD PRN.   triamcinolone cream (KENALOG) 0.1 % - B/L lower leg  Reviewed and discussed if Veins worsen or become symptomatic, he may want to pursue treatment with the Vascular surgeons.  Return if symptoms worsen or fail to improve.  Maylene Roes, CMA, am acting as scribe for Armida Sans, MD .  Documentation: I have reviewed the above documentation for accuracy and completeness, and I agree with the above.  Armida Sans, MD

## 2020-03-04 ENCOUNTER — Encounter: Payer: Self-pay | Admitting: Dermatology

## 2020-04-17 ENCOUNTER — Telehealth: Payer: BC Managed Care – PPO | Admitting: Neurology

## 2020-06-28 ENCOUNTER — Other Ambulatory Visit: Payer: Self-pay | Admitting: Neurosurgery

## 2020-06-28 DIAGNOSIS — G35 Multiple sclerosis: Secondary | ICD-10-CM

## 2020-07-14 ENCOUNTER — Other Ambulatory Visit: Payer: Self-pay

## 2020-07-14 ENCOUNTER — Ambulatory Visit
Admission: RE | Admit: 2020-07-14 | Discharge: 2020-07-14 | Disposition: A | Discharge status: Home / Self Care | Payer: BC Managed Care – PPO | Source: Ambulatory Visit | Attending: Neurosurgery | Admitting: Neurosurgery

## 2020-07-14 DIAGNOSIS — G35 Multiple sclerosis: Secondary | ICD-10-CM

## 2020-07-14 MED ORDER — GADOBUTROL 1 MMOL/ML IV SOLN
7.5000 mL | Freq: Once | INTRAVENOUS | Status: AC | PRN
  Administered 2020-07-14: 7.5 mL via INTRAVENOUS

## 2021-07-18 ENCOUNTER — Ambulatory Visit: Payer: BC Managed Care – PPO

## 2021-10-25 IMAGING — MR MR HEAD WO/W CM
15 series · 48 of 48 positions shown · IV contrast (7ml Gadavist)
Comparison: Base

CLINICAL DATA: Lip/mouth numbness, right tongue numbness

EXAM:
MRI HEAD WITHOUT AND WITH CONTRAST
TECHNIQUE: Multiplanar, multiecho pulse sequences of the brain and surrounding
structures were obtained without and with intravenous contrast.
CONTRAST:  7mL GADAVIST GADOBUTROL 1 MMOL/ML IV SOLN

[Series 5: ax dwi_tracew · axial · 3.0mm · 0.60mm/px · z∈[-69,+84]mm · 4 of 48 slices shown]
[im 1/48]
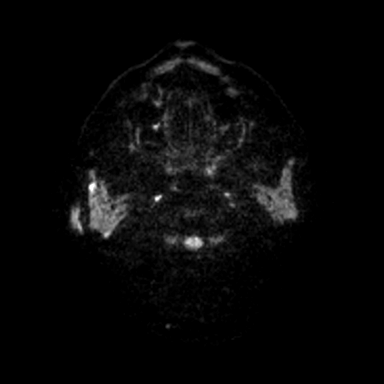
[im 16/48]
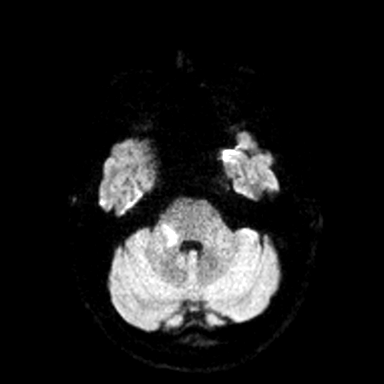
[im 32/48]
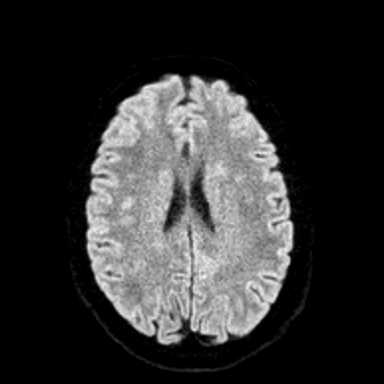
[im 48/48]
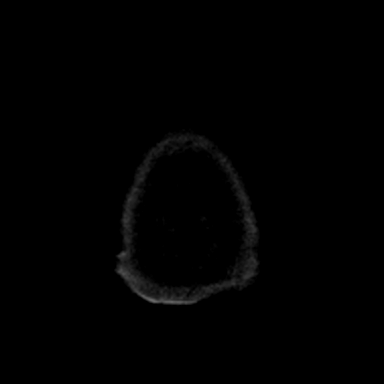

[Series 6: ax dwi_adc · axial · 3.0mm · 0.60mm/px · z∈[-69,+84]mm · 3 of 48 slices shown]
[im 1/48]
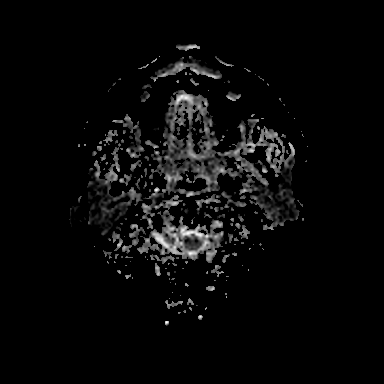
[im 24/48]
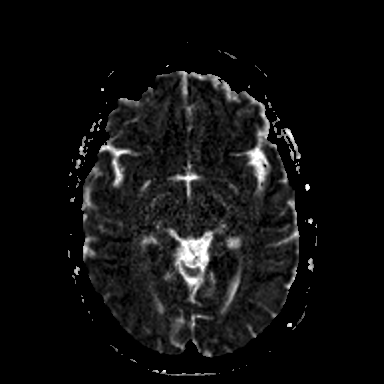
[im 48/48]
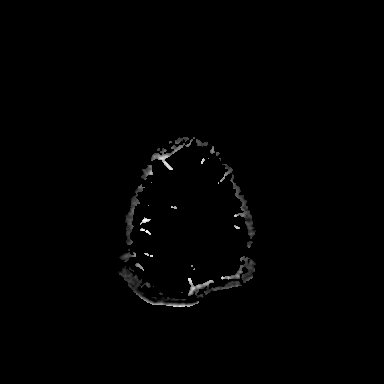

[Series 7: cor dwi_tracew · coronal · 5.0mm · 0.60mm/px · 2 of 38 slices shown]
[im 1/38]
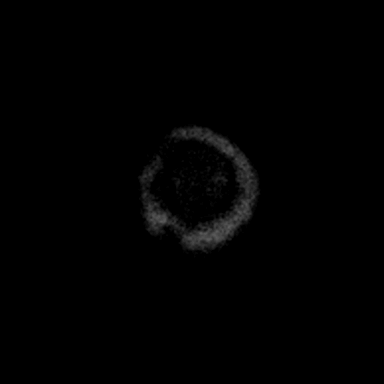
[im 38/38]
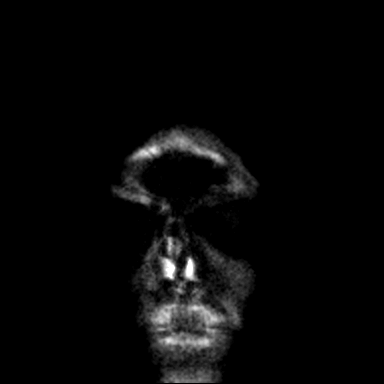

[Series 8: cor dwi_adc · coronal · 5.0mm · 0.60mm/px · 2 of 38 slices shown]
[im 1/38]
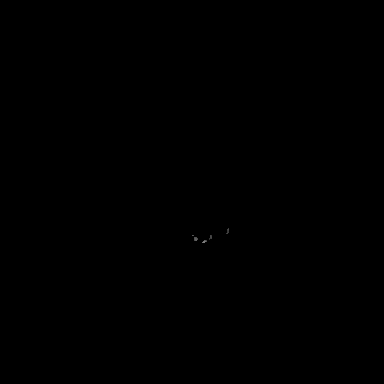
[im 38/38]
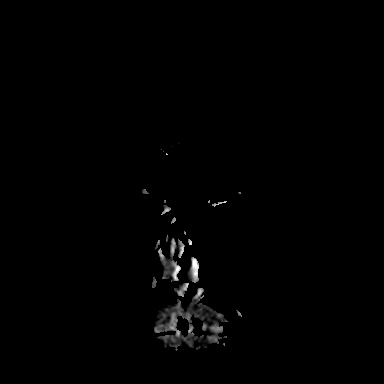

[Series 9: T1 · sagittal · 5.0mm · 0.62mm/px · 1 of 21 slices shown (1 of 2)]
[im 1/21]
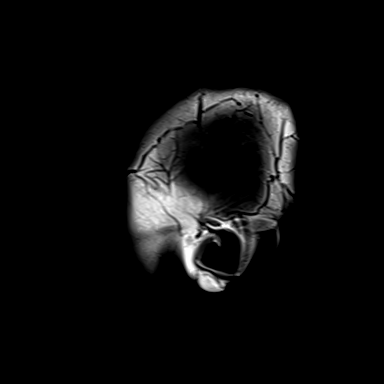

[Series 10: FLAIR · axial · 3.0mm · 0.53mm/px · z∈[-73,+86]mm · 3 of 55 slices shown (1 of 2)]
[im 1/55]
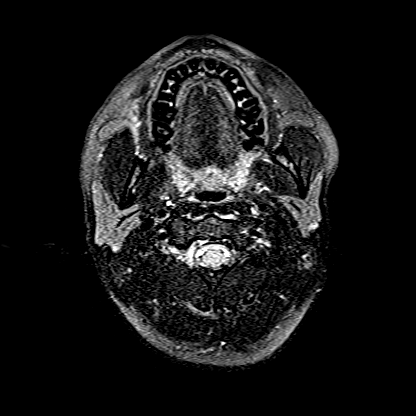
[im 28/55]
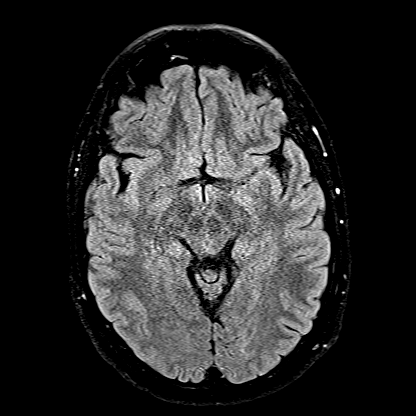
[im 55/55]
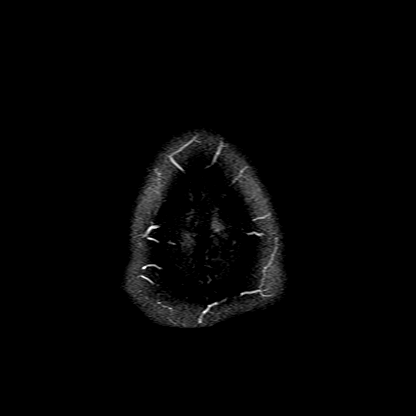

[Series 11: T2 · axial · 5.0mm · 0.53mm/px · 1 of 25 slices shown]
[im 1/25]
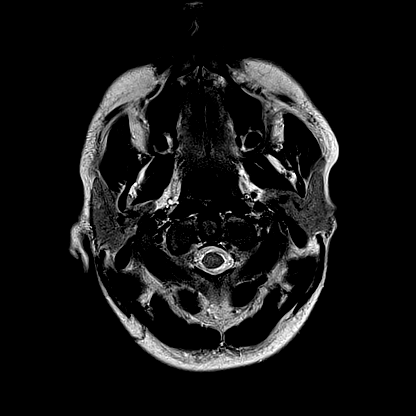

[Series 12: mag_images · axial · 3.0mm · 0.90mm/px · z∈[-79,+95]mm · 3 of 60 slices shown]
[im 1/60]
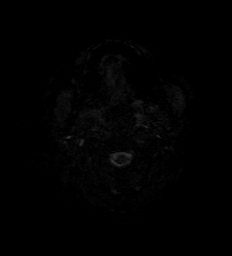
[im 30/60]
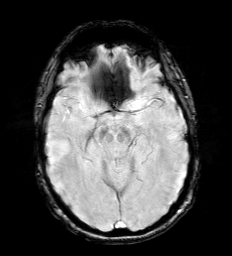
[im 60/60]
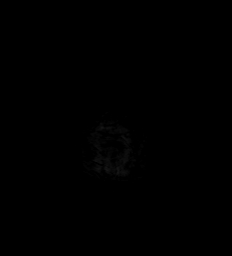

[Series 13: pha_images · axial · 3.0mm · 0.90mm/px · z∈[-79,+95]mm · 3 of 60 slices shown]
[im 1/60]
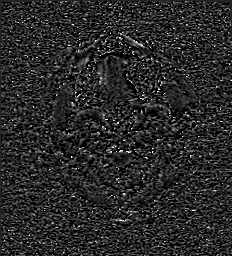
[im 30/60]
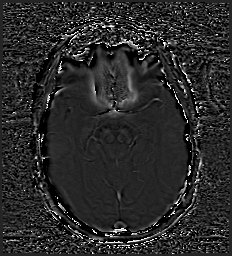
[im 60/60]
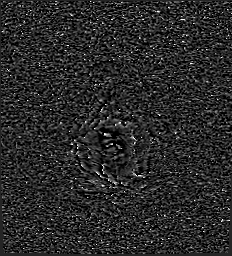

[Series 14: swi_images · axial · 3.0mm · 0.90mm/px · z∈[-79,+95]mm · 3 of 60 slices shown]
[im 1/60]
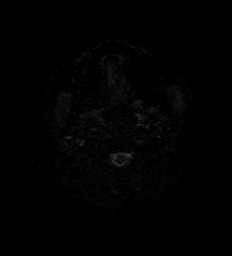
[im 30/60]
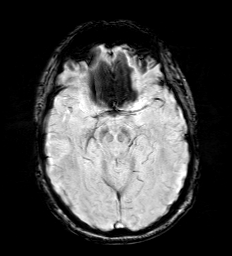
[im 60/60]
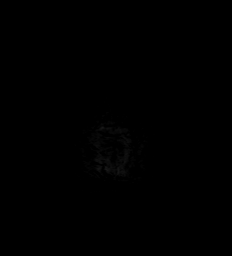

[Series 16: T1 · axial · 1.0mm · 0.98mm/px · z∈[-69,+88]mm · 9 of 160 slices shown (2 of 2)]
[im 1/160]
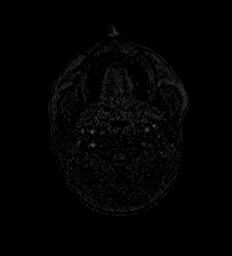
[im 20/160]
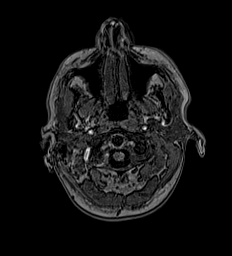
[im 40/160]
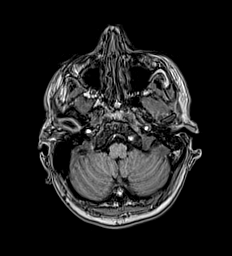
[im 60/160]
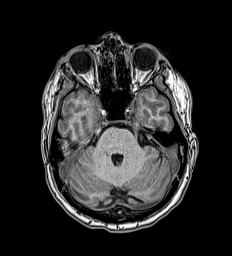
[im 80/160]
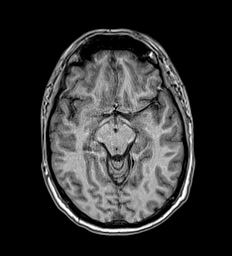
[im 100/160]
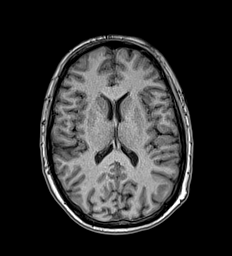
[im 120/160]
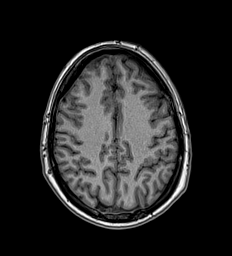
[im 140/160]
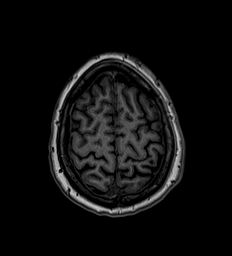
[im 160/160]
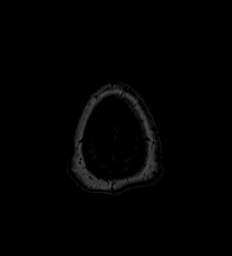

[Series 17: FLAIR · sagittal · 5.0mm · 0.94mm/px · 1 of 20 slices shown (2 of 2)]
[im 1/20]
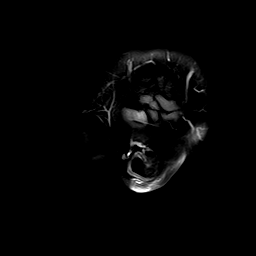

[Series 18: T2 post-contrast · coronal · 5.0mm · 0.57mm/px · 2 of 29 slices shown]
[im 1/29]
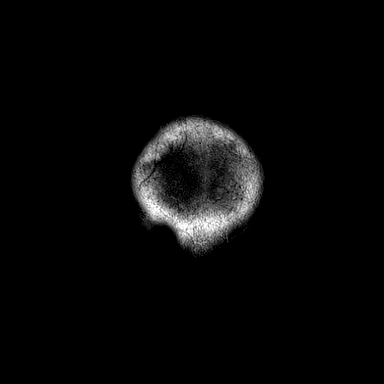
[im 29/29]
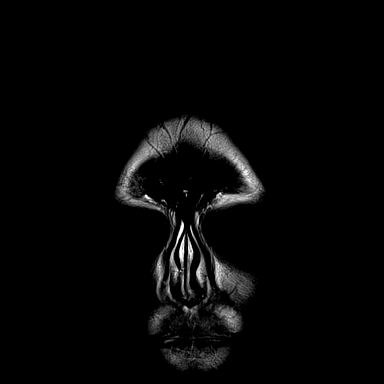

[Series 19: T1 post-contrast · axial · 1.0mm · 0.98mm/px · z∈[-69,+88]mm · 9 of 160 slices shown (1 of 2)]
[im 1/160]
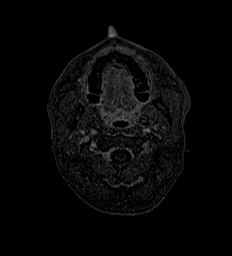
[im 20/160]
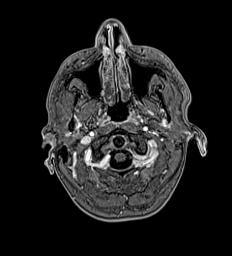
[im 40/160]
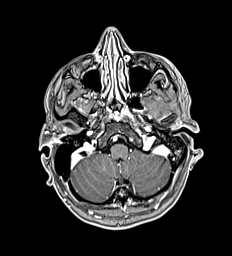
[im 60/160]
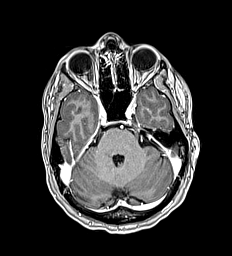
[im 80/160]
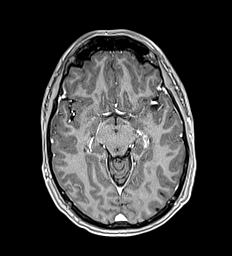
[im 100/160]
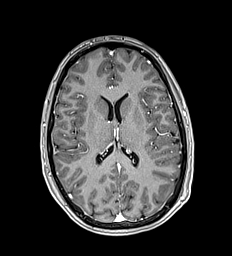
[im 120/160]
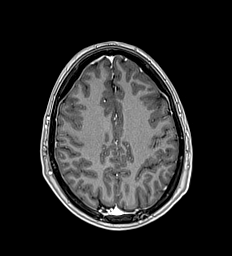
[im 140/160]
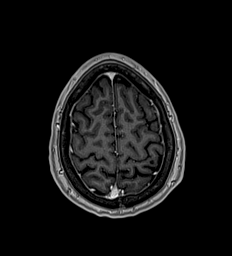
[im 160/160]
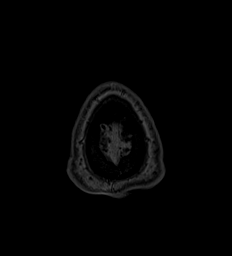

[Series 20: T1 post-contrast · coronal · 5.0mm · 0.57mm/px · 2 of 29 slices shown (2 of 2)]
[im 1/29]
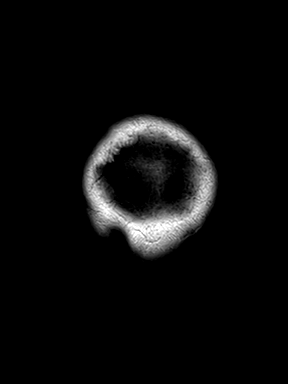
[im 29/29]
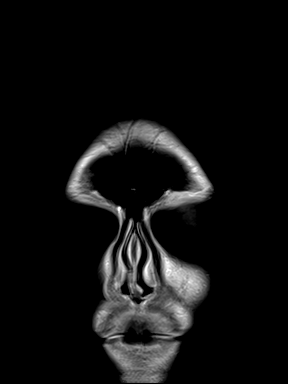

[48 of 48 positions shown; findings below may reference images not displayed]

FINDINGS: Brain: There is a 14 mm area of mildly reduced diffusion and T2
shine through involving the right brachium pontis. No associated
enhancement. There are a few additional nonenhancing foci of T2
hyperintensity in periventricular white matter. There may be
involvement of the posterior callososeptal interface. No
juxtacortical lesions identified.

No intracranial mass or mass effect. No evidence of volume loss or
hydrocephalus. No extra-axial collection.

Vascular: Major vessel flow voids at the skull base are preserved.

Skull and upper cervical spine: Normal marrow signal is preserved.

Sinuses/Orbits: Trace mucosal thickening.  Orbits are unremarkable.

Other: Sella is unremarkable.  Mastoid air cells are clear.
IMPRESSION: Mild burden of white matter lesions in a pattern suspicious for
primary demyelinating disease such as multiple sclerosis. There is
no abnormal enhancement to suggest active demyelination. However,
there is mildly reduced diffusion associated with the right middle
cerebellar peduncle lesion, which could also reflect more acute
inflammation.

## 2022-07-20 ENCOUNTER — Encounter (INDEPENDENT_AMBULATORY_CARE_PROVIDER_SITE_OTHER): Payer: Self-pay
